# Patient Record
Sex: Male | Born: 1980 | Race: White | Hispanic: No | Marital: Married | Smoking: Never smoker
Health system: Southern US, Community
[De-identification: ages and names within clinical notes are randomized; demographics above are authoritative.]

## PROBLEM LIST (undated history)

## (undated) DIAGNOSIS — Z9989 Dependence on other enabling machines and devices: Principal | ICD-10-CM

## (undated) DIAGNOSIS — E669 Obesity, unspecified: Secondary | ICD-10-CM

## (undated) DIAGNOSIS — M1990 Unspecified osteoarthritis, unspecified site: Secondary | ICD-10-CM

## (undated) DIAGNOSIS — G4733 Obstructive sleep apnea (adult) (pediatric): Secondary | ICD-10-CM

## (undated) DIAGNOSIS — G809 Cerebral palsy, unspecified: Secondary | ICD-10-CM

## (undated) DIAGNOSIS — S069X9A Unspecified intracranial injury with loss of consciousness of unspecified duration, initial encounter: Secondary | ICD-10-CM

## (undated) HISTORY — DX: Obesity, unspecified: E66.9

## (undated) HISTORY — DX: Obstructive sleep apnea (adult) (pediatric): G47.33

## (undated) HISTORY — DX: Cerebral palsy, unspecified: G80.9

## (undated) HISTORY — DX: Dependence on other enabling machines and devices: Z99.89

## (undated) HISTORY — DX: Unspecified intracranial injury with loss of consciousness of unspecified duration, initial encounter: S06.9X9A

---

## 1998-10-17 ENCOUNTER — Encounter: Payer: Self-pay | Admitting: Family Medicine

## 1998-10-17 ENCOUNTER — Ambulatory Visit (HOSPITAL_COMMUNITY): Admission: RE | Admit: 1998-10-17 | Discharge: 1998-10-17 | Payer: Self-pay | Admitting: Family Medicine

## 1998-10-24 ENCOUNTER — Encounter: Payer: Self-pay | Admitting: Family Medicine

## 1998-10-24 ENCOUNTER — Ambulatory Visit (HOSPITAL_COMMUNITY): Admission: RE | Admit: 1998-10-24 | Discharge: 1998-10-24 | Payer: Self-pay | Admitting: Family Medicine

## 2001-12-22 DIAGNOSIS — S069X9A Unspecified intracranial injury with loss of consciousness of unspecified duration, initial encounter: Secondary | ICD-10-CM

## 2001-12-22 DIAGNOSIS — S069XAA Unspecified intracranial injury with loss of consciousness status unknown, initial encounter: Secondary | ICD-10-CM

## 2001-12-22 HISTORY — DX: Unspecified intracranial injury with loss of consciousness of unspecified duration, initial encounter: S06.9X9A

## 2001-12-22 HISTORY — DX: Unspecified intracranial injury with loss of consciousness status unknown, initial encounter: S06.9XAA

## 2002-02-19 HISTORY — PX: OTHER SURGICAL HISTORY: SHX169

## 2002-05-16 ENCOUNTER — Encounter: Admission: RE | Admit: 2002-05-16 | Discharge: 2002-08-14 | Payer: Self-pay | Admitting: *Deleted

## 2002-08-15 ENCOUNTER — Encounter: Admission: RE | Admit: 2002-08-15 | Discharge: 2002-11-13 | Payer: Self-pay | Admitting: *Deleted

## 2002-11-14 ENCOUNTER — Encounter: Admission: RE | Admit: 2002-11-14 | Discharge: 2003-01-31 | Payer: Self-pay | Admitting: *Deleted

## 2003-09-15 ENCOUNTER — Ambulatory Visit (HOSPITAL_COMMUNITY): Admission: RE | Admit: 2003-09-15 | Discharge: 2003-09-15 | Payer: Self-pay | Admitting: Family Medicine

## 2003-09-15 ENCOUNTER — Encounter: Payer: Self-pay | Admitting: Family Medicine

## 2004-01-05 ENCOUNTER — Ambulatory Visit (HOSPITAL_COMMUNITY): Admission: RE | Admit: 2004-01-05 | Discharge: 2004-01-05 | Payer: Self-pay | Admitting: Family Medicine

## 2004-03-14 ENCOUNTER — Ambulatory Visit (HOSPITAL_BASED_OUTPATIENT_CLINIC_OR_DEPARTMENT_OTHER): Admission: RE | Admit: 2004-03-14 | Discharge: 2004-03-14 | Payer: Self-pay | Admitting: Orthopedic Surgery

## 2009-12-22 DIAGNOSIS — G4733 Obstructive sleep apnea (adult) (pediatric): Secondary | ICD-10-CM

## 2009-12-22 HISTORY — DX: Obstructive sleep apnea (adult) (pediatric): G47.33

## 2014-03-22 ENCOUNTER — Ambulatory Visit (INDEPENDENT_AMBULATORY_CARE_PROVIDER_SITE_OTHER): Payer: BC Managed Care – PPO | Admitting: Neurology

## 2014-03-22 ENCOUNTER — Encounter (INDEPENDENT_AMBULATORY_CARE_PROVIDER_SITE_OTHER): Payer: Self-pay

## 2014-03-22 ENCOUNTER — Encounter: Payer: Self-pay | Admitting: Neurology

## 2014-03-22 VITALS — BP 115/74 | HR 72 | Resp 18 | Ht 70.5 in | Wt 297.0 lb

## 2014-03-22 DIAGNOSIS — Z9989 Dependence on other enabling machines and devices: Principal | ICD-10-CM

## 2014-03-22 DIAGNOSIS — G4733 Obstructive sleep apnea (adult) (pediatric): Secondary | ICD-10-CM

## 2014-03-22 HISTORY — DX: Obstructive sleep apnea (adult) (pediatric): G47.33

## 2014-03-22 NOTE — Progress Notes (Signed)
[facility]  Provider:  Melvyn Novas, M D  Referring Provider: Eartha Inch, MD Primary Care Physician:  Eartha Inch, MD  Chief Complaint  Patient presents with  . Follow-up    Room 11  . Sleep Apnea    HPI:  Steve Gutierrez is a 33 y.o. male  Is seen here as a referral/ revisit  from Dr. Cyndia Bent for CPAP compliance.    Steve Gutierrez  is an established patient of our sleep clinic since 2012. He has left  Hemiparesis- he was born with cerebral palsy but this only very mildly impaired him, he suffered a severely disabling head injury and fracture to his arm and shoulder later at age 12 and this has caused him to have significant plegia in the left arm- he is right-hand dominant.   The patient was diagnosed in a split study in 2011 with obstructive sleep apnea.  [name] was the referring physician. On 06-26-2010 the patient had a body mass index of 46.  Endorsed the Epworth sleepiness scale at 12 points, Becks  depression inventory at 8 points.  He had elevated blood pressures prior to the study. His AHI was 66.4 in supine sleep 60.1 and in REM sleep 26.3.  the patient's study was split into a CPAP titration. He did best at 14 cm water with an EPR of 2 cm water .  Today he is here for his yearly revisit. Endorsed  the fatigue score a 23 points, the Epworth score at 9 points. The last time that days of CPAP he was also reviewed. The patient is still using 14 cm water pressure with an EP are currently at 2 cm water. He has moderate air leaks, the residual AHI of only 1.0. He has excellent compliance with an average time and CPAP therapy daily of 8 hours and 17 minutes. His compliance for the last 100 days of 100%.  His sleep time is 9 PM to 5 Am, wakes up spontaneously  but has an alarm back up.he sleeps through the night , he falls asleep promptly. He consumes no caffeine, no nicotine, no ETOH use.    Review of Systems: Out of a complete 14 system review, the  patient complains of only the following symptoms, and all other reviewed systems are negative. Normal Epworth and FSS score.   History   Social History  . Marital Status: Married    Spouse Name: [name]    Number of Children: 0  . Years of Education: Bachelor's   Occupational History  . Not on file.   Social History Main Topics  . Smoking status: Never Smoker   . Smokeless tobacco: Never Used  . Alcohol Use: No  . Drug Use: No  . Sexual Activity: Not on file   Other Topics Concern  . Not on file   Social History Narrative   Patient is married [name]) and lives with his wife and children.   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    Patient is also a Naval architect.   Patient is right-handed.   Patient does not drink any caffeine.   Patient has a Bachelor's degree.             History reviewed. No pertinent family history.  Past Medical History  Diagnosis Date  . 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 Obesity   . OSA (obstructive sleep apnea) 2011    Severe- AHI 66   . OSA on CPAP 03/22/2014    Past Surgical History  Procedure Laterality Date  . Severe fall  02/2002    TBI, COMA    No current outpatient prescriptions on file.   No current facility-administered medications for this visit.    Allergies as of 03/22/2014  . (Not on File)    Vitals: BP 115/74  Pulse 72  Resp 18  Ht 5' 10.5" (1.791 m)  Wt 297 lb (134.718 kg)  BMI 42.00 kg/m2 Last Weight:  Wt Readings from Last 1 Encounters:  03/22/14 297 lb (134.718 kg)   Last Height:   Ht Readings from Last 1 Encounters:  03/22/14 5' 10.5" (1.791 m)    Physical exam:  General: The patient is awake, alert and appears not in acute distress. The patient is well groomed. Head: Normocephalic, atraumatic. Neck is supple. Mallampati 3 uvula is  lower on the right , neck circumference: 19 inches.  Cardiovascular:  Regular rate and rhythm , without  murmurs or carotid bruit, and without distended neck veins. Respiratory: Lungs are clear to auscultation. Skin:  Without evidence of edema, or rash Trunk: BMI remains elevated .  Regular exerciser.  Half- Marathon.   Neurologic exam : The patient is awake and alert, oriented to place and time.  Memory subjective described as intact.  There is a normal attention span & concentration ability. Speech is fluent without dysarthria, dysphonia or aphasia. Mood and affect are appropriate.  Cranial nerves: Pupils are equal and briskly reactive to light. Hearing to finger rub intact. Facial sensation intact to fine touch. Facial motor strength is symmetric and tongue is  midline.  Motor exam:   Left side elevated tone and lower mass .  Sensory: intact ,Coordination:  Only on the right :Rapid alternating movements normal. Finger-to-nose maneuver without evidence of ataxia, dysmetria or tremor.  Gait and station: Patient walks without assistive device.  Assessment:  After physical and neurologic examination, review of laboratory studies, imaging, neurophysiology testing and pre-existing records, assessment is   Obesity with resulting OSA, additional complicating is the uvula asymmetry .  CPAP has let to complete resolution of the AHI.      Plan:  Treatment plan and additional workup : RV in 12 month. No changes in settings.

## 2014-11-07 ENCOUNTER — Telehealth: Payer: Self-pay | Admitting: Neurology

## 2014-11-07 NOTE — Telephone Encounter (Signed)
Left message for patient regarding rescheduling 01/09/15 appointment per Larita Fife leaving, rescheduled to Dr. Oliva Bustard first available on 01/10/15.

## 2014-11-09 ENCOUNTER — Ambulatory Visit (INDEPENDENT_AMBULATORY_CARE_PROVIDER_SITE_OTHER): Payer: BC Managed Care – PPO | Admitting: Sports Medicine

## 2014-11-09 ENCOUNTER — Encounter: Payer: Self-pay | Admitting: Sports Medicine

## 2014-11-09 VITALS — BP 117/78 | HR 80 | Ht 70.0 in | Wt 284.0 lb

## 2014-11-09 DIAGNOSIS — M21372 Foot drop, left foot: Secondary | ICD-10-CM | POA: Insufficient documentation

## 2014-11-09 DIAGNOSIS — G809 Cerebral palsy, unspecified: Secondary | ICD-10-CM | POA: Insufficient documentation

## 2014-11-09 DIAGNOSIS — S069XAA Unspecified intracranial injury with loss of consciousness status unknown, initial encounter: Secondary | ICD-10-CM | POA: Insufficient documentation

## 2014-11-09 DIAGNOSIS — S069X5S Unspecified intracranial injury with loss of consciousness greater than 24 hours with return to pre-existing conscious level, sequela: Secondary | ICD-10-CM

## 2014-11-09 DIAGNOSIS — S069X9A Unspecified intracranial injury with loss of consciousness of unspecified duration, initial encounter: Secondary | ICD-10-CM | POA: Insufficient documentation

## 2014-11-09 NOTE — Progress Notes (Signed)
Steve Gutierrez - 33 y.o. 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 He has been running on a daily basis for the past decade and has completed 3 1/2 marathons this past year in spite of the significant foot drop.  He is interested in potential brace to help his performance and to decrease the amount of toe drag as well as falls that he is sustaining due to catching his toe over obstacles including curbs and roots. He denies any significant pain or muscle spasms. He is not performing any specific therapeutic exercise. He has never tried a brace.Marland Kitchen He is on no chronic medications.  He does have an associated left hand flexor contracture with swan-neck deformities; no brace, TherEx, or intervention tried for this.  ROS:  Per HPI.   HISTORY: Past Medical, Surgical, Social, and Family History Reviewed & Updated per EMR.  Pertinent Historical Findings include: Cerebral palsy from birth with only minimal symptoms to childhood Traumatic brain injury due to repelling accident resulting in significant foot drop and hand weakness. Prior left fifth toe surgery for fracture by Dr. Eulah Pont. History of OSA on CPAP He is a nonsmoker, nondrinker. Works at [employer] as a Psychologist, occupational.   OBJECTIVE:  VS:   HT:5\' 10"  (177.8 cm)   WT:284 lb (128.822 kg)  BMI:40.8          BP:117/78 mmHg  HR:80bpm  TEMP: ( )  RESP:   PHYSICAL EXAM: GENERAL: adult obese Caucasian male. In no discomfort; no respiratory distress   PSYCH: alert and appropriate, good insight  VASCULAR: bilateral DP and PT pulses 2+/4.  No significant edema.  SKIN: Scarring over bilateral anterior knees    NEUROLOGIC EXAM: Gross Deficits: significant left-sided flexor contractures of the wrist and ankle   Speech: fluid with minimal slurring   Gait: see below     Strength: 5 out of 5 strength in right hip flexion and knee extension. Unable to toe off on the left foot at all. 3 out of 5 strength and dorsiflexion and inversion of the ankle. Plantar flexion 5 out of 5   Sensation:  patient reports intact to light touch in all lower extremity dermatomes   Reflexes: left Achilles reflex absent     Walking & Running Gait & Functional Exam:  General:  significant Trendelenburg with left hip shift. Hip flexor dominant. Significant toe drop with poor toe clearance. Significant tibial external rotation on the left.    Strike/foot:  foot slap present with running   Other:  left foot markedly deformed with significant bunion formation and bunionette formation. There is midfoot bossing. There is a well-healed incisional scar over the distal fifth metatarsal. Longitudinal arch is well maintained bilaterally with significant transverse arch breakdown on the left.  Shoes reveal significant wear pattern over the toe of the left foot.    ASSESSMENT: 1. Left foot drop   2. Cerebral palsy   3. Brain injury, with loss of consciousness greater than 24 hours with return to pre-existing conscious level, sequela    Complex gait abnormality related to underlying Cerebral Palsy, complicated by traumatic brain injury. Significant foot drop with Equinus contracture limiting functional gait.    PLAN: See problem based charting & AVS for additional documentation.  Medially wedged toe lift provided  Prescription for dynamic ASO [facility]  Toe Off or equivalent)  Passive ankle dorsi flexion exercises reviewed today  Okay to continue exercise program.  Will likely need new cushioned custom orthotics once brace obtained and will plan to add medially wedged first ray post and likely transverse arch support. > Return in about 8 weeks (around 01/04/2015).

## 2014-12-19 ENCOUNTER — Telehealth: Payer: Self-pay | Admitting: *Deleted

## 2014-12-19 NOTE — Telephone Encounter (Signed)
Spoke to pt when he called back.   Was understandably irritated when asked to change appt again.  He  did not think needed to come in, since insurance requirement.  He was going to call insurance co.  I would let Dr. Vickey Huger know.

## 2014-12-28 ENCOUNTER — Telehealth: Payer: Self-pay | Admitting: *Deleted

## 2014-12-28 NOTE — Telephone Encounter (Signed)
I called and LMVM for pt to return call about appt on 01-10-15 at 1030.  I can keep at 1030, (Dr.Dohmeier may run 15 min later then this).  If this is ok? With him?

## 2014-12-29 NOTE — Telephone Encounter (Signed)
Pt wants to know if he can bring his memory card and have it downloaded.  He will be here for the appointment but doesn't feel he needs a face to face with Dr. Vickey Huger. This is fine with him if it will help her.  Please call back and advise.

## 2014-12-29 NOTE — Telephone Encounter (Signed)
Called pt and relayed that this is good. Will see Dr. Vickey Huger.  Bring card. LMVM for pt.

## 2015-01-09 ENCOUNTER — Ambulatory Visit: Payer: BC Managed Care – PPO | Admitting: Nurse Practitioner

## 2015-01-10 ENCOUNTER — Ambulatory Visit (INDEPENDENT_AMBULATORY_CARE_PROVIDER_SITE_OTHER): Payer: BLUE CROSS/BLUE SHIELD | Admitting: Neurology

## 2015-01-10 ENCOUNTER — Encounter: Payer: Self-pay | Admitting: Neurology

## 2015-01-10 ENCOUNTER — Encounter: Payer: Self-pay | Admitting: Sports Medicine

## 2015-01-10 ENCOUNTER — Ambulatory Visit (INDEPENDENT_AMBULATORY_CARE_PROVIDER_SITE_OTHER): Payer: BLUE CROSS/BLUE SHIELD | Admitting: Sports Medicine

## 2015-01-10 VITALS — BP 128/69 | HR 81 | Resp 22 | Ht 70.25 in | Wt 292.0 lb

## 2015-01-10 VITALS — BP 117/78 | HR 80 | Ht 70.0 in | Wt 283.0 lb

## 2015-01-10 DIAGNOSIS — M21372 Foot drop, left foot: Secondary | ICD-10-CM | POA: Diagnosis not present

## 2015-01-10 DIAGNOSIS — S069X9A Unspecified intracranial injury with loss of consciousness of unspecified duration, initial encounter: Secondary | ICD-10-CM | POA: Insufficient documentation

## 2015-01-10 DIAGNOSIS — G808 Other cerebral palsy: Secondary | ICD-10-CM | POA: Insufficient documentation

## 2015-01-10 DIAGNOSIS — G4733 Obstructive sleep apnea (adult) (pediatric): Secondary | ICD-10-CM

## 2015-01-10 DIAGNOSIS — S069XAA Unspecified intracranial injury with loss of consciousness status unknown, initial encounter: Secondary | ICD-10-CM | POA: Insufficient documentation

## 2015-01-10 DIAGNOSIS — S069X5D Unspecified intracranial injury with loss of consciousness greater than 24 hours with return to pre-existing conscious level, subsequent encounter: Secondary | ICD-10-CM

## 2015-01-10 DIAGNOSIS — Z9989 Dependence on other enabling machines and devices: Principal | ICD-10-CM

## 2015-01-10 NOTE — Progress Notes (Signed)
   Subjective:    Patient ID: Steve Gutierrez, male    DOB: 1981-10-02, 34 y.o.   MRN: [id number]  HPI Steve Gutierrez is a 34 year old male who presents today for 8 week follow up on his L foot drop secondary to CP and a traumatic brain injury. Last visit, he was given an AFO with light weight design and an anterior panel and ankle dorsiflexion exercises. He has been wearing his AFO regularly and notices he has not fallen in several weeks. Before the AFO, he would typically fall every 2 miles. He currently runs 1 - 2 miles everyday and 5- 7 miles on Saturday - all without falling. He has noticed he is a little slower with the AFO, but he is confident he will improve over time. He believes the ankle exercises are helping improve his ankle range of motion.   He is currently attempting to lose weight. He is running frequently as mentioned above and his friend, whom is a Systems analyst, is providing dieting advice. He is trying to decrease his caloric intake everyday and eat more fruit. He is training for his 17th half marathon in the spring.    Review of Systems     Objective:   Physical Exam Well developed. Well nourished. No acute distress.  BP 117/78 mmHg  Pulse 80  Ht 5\' 10"  (1.778 m)  Wt 283 lb (128.368 kg)  BMI 40.61 kg/m2  General: Left sided flexor contractures of the wrist and elbow   Left ankle: No swelling or bruising present. Non-tender to palpation. 5/5 strength in plantar flexion. 3/5 strength in dorsiflexion and inversion. Unable to lift left toe at all.   Left foot: Left foot has bunion formation and transverse arch breakdown. There is a plantar flexion and eversion contracture noted  Gait: Trendelenburg gait with increased left hip Flexion and a full foot drop on left. Significant Tibial external rotation on the left. And pronation and plantar flexion on left foot/ankle.       Assessment & Plan:   Note originally written by [facility]. Note  reviewed/edited by Dr. [facility].

## 2015-01-10 NOTE — Assessment & Plan Note (Addendum)
Left Foot drop secondary to CP and a traumatic brain injury.   Continue with the following:  1) Medially wedged toe lift provided for walking shoes.  2) Dynamic Ankle foot orthosis for running and walking - new design allows functional ankle movement with anterior panel 3) Continue wearing orthotics 4) Advised to continue passive ankle dorsi flexion exercises  Advised to alter diet:  1) Eat 5 servings of fruit daily to help feel satieted and increase weight loss. Talked with patient about the "grapefuit diet" and the importance of fruit intake. Cut down on excess calories and CHOs.  Follow up as needed.

## 2015-01-10 NOTE — Progress Notes (Signed)
[facility]  Provider:  Melvyn Novas, M D  Referring Provider: Eartha Inch, MD Primary Care Physician:  Eartha Inch, MD  Chief Complaint  Patient presents with  . RV cpap    Rm 10, alone    HPI:  Steve Gutierrez is a 34 y.o. male  Is seen here as a referral/ revisit  from Dr. Cyndia Bent for CPAP compliance.    Steve Gutierrez  is an established patient of our sleep clinic since 2012. He has left sided  Hemiparesis- he was born with cerebral palsy , but this only very mildly impaired him, he suffered a severely disabling head injury and fracture to his arm and shoulder later at age 34 and this has caused him to have significant plegia in the left arm- he is right-hand dominant.   The patient was diagnosed in a split study in 2011 with obstructive sleep apnea.  [name] was the referring physician. On 06-26-2010 the patient had a body mass index of 46.  Endorsed the Epworth sleepiness scale at 12 points, Becks  depression inventory at 8 points.  He had elevated blood pressures prior to the study. His AHI was 66.4 in supine sleep 60.1 and in REM sleep 26.3., the patient's study was split into a CPAP titration. He did best at 14 cm water with an EPR of 2 cm water .  2015 , CD - yearly revisit. Endorsed  the fatigue score a 23 points, the Epworth score at 9 points. The last time that days of CPAP he was also reviewed. The patient is still using 14 cm water pressure with an EP are currently at 2 cm water. He has moderate air leaks, the residual AHI of only 1.0. He has excellent compliance with an average time and CPAP therapy daily of 8 hours and 17 minutes. His compliance for the last 100 days of 100%. His sleep time is 9 PM to 5 Am, wakes up spontaneously  but has an alarm back up.he sleeps through the night , he falls asleep promptly. He consumes no caffeine, no nicotine, no ETOH use.   01-10-15, CD yearly RV;  90 days download with 14 cm setting and 3 cm EPR, 8 hours  and 7 minutes, 100% compliance for days and over 4 hours of use. Excellent  AHI of 1.0/hr.  No new problems comorbidities or recent hospitalizations that would influence his CPAP needs his weight has been stable. On his review of systems he endorsed the fatigue score at 37 and the Epworth Sleepiness Scale at 12 points.     Review of Systems: Out of a complete 14 system review, the patient complains of only the following symptoms, and all other reviewed systems are negative. 12 points on Epworth and FSS score of 37, no longer nocturia, no snoring. Sleeps through the night. 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 Marland Kitchen   History   Social History  . Marital Status: Married    Spouse Name: [name]    Number of Children: 0  . Years of Education: Bachelor's   Occupational History  . Not on file.   Social History Main Topics  . Smoking status: Never Smoker   . Smokeless tobacco: Never Used  . Alcohol Use: No  . Drug Use: No  . Sexual Activity: Not on file   Other Topics Concern  . Not on file  Social History Narrative   Patient is married [name]) and lives with his wife and children.   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    Patient is also a Naval architect.   Patient is right-handed.   Patient does not drink any caffeine.   Patient has a Bachelor's degree.             History reviewed. No pertinent family history.  Past Medical History  Diagnosis Date  . 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 Obesity   . OSA (obstructive sleep apnea) 2011    Severe- AHI 66   . OSA on CPAP 03/22/2014    Past Surgical History  Procedure Laterality Date  . Severe fall  02/2002    TBI, COMA    No current outpatient prescriptions on file.   No current  facility-administered medications for this visit.    Allergies as of 01/10/2015  . (No Known Allergies)    Vitals: BP 128/69 mmHg  Pulse 81  Resp 22  Ht 5' 10.25" (1.784 m)  Wt 292 lb (132.45 kg)  BMI 41.62 kg/m2 Last Weight:  Wt Readings from Last 1 Encounters:  01/10/15 292 lb (132.45 kg)   Last Height:   Ht Readings from Last 1 Encounters:  01/10/15 5' 10.25" (1.784 m)    Physical exam:  General: The patient is awake, alert and appears not in acute distress. The patient is well groomed. Head: Normocephalic, atraumatic. Neck is supple. Mallampati 3 uvula is lower on the right , neck circumference: 19 inches.  Cardiovascular:  Regular rate and rhythm , without  murmurs or carotid bruit, and without distended neck veins. Respiratory: Lungs are clear to auscultation. Skin:  Without evidence of edema, or rash Trunk: BMI remains elevated .  Regular exerciser.  Half- Marathon.  Still active, ran 16 thus far.   Neurologic exam : The patient is awake and alert, oriented to place and time.  Memory subjective described as intact.  There is a normal attention span & concentration ability. Speech is fluent without dysarthria, dysphonia or aphasia.  Mood and affect are appropriate.  Cranial nerves: Pupils are equal and briskly reactive to light. Hearing to finger rub intact. Facial sensation intact to fine touch. Facial motor strength is symmetric and tongue is  midline.  Motor exam:   Left side elevated tone and lower mass .  Sensory: intact ,Coordination:  Only on the right :Rapid alternating movements normal. Finger-to-nose maneuver without evidence of ataxia, dysmetria or tremor.  Gait and station: Patient walks without assistive device. Hemiparesis affects mostly right hand and arm.   Assessment:  After physical and neurologic examination, review of laboratory studies, imaging, neurophysiology testing and pre-existing records, assessment is   Obesity with resulting OSA,  additional complicating is the uvula asymmetry, attributed to his CNS hemiparesis.   .  CPAP has let to complete resolution of the AHI.      Plan:  Treatment plan and additional workup :  RV  Wit me or NP in 12 month.  No changes in CPAP settings, currently at 11 cm water .

## 2015-07-16 ENCOUNTER — Ambulatory Visit (INDEPENDENT_AMBULATORY_CARE_PROVIDER_SITE_OTHER): Payer: BLUE CROSS/BLUE SHIELD | Admitting: Sports Medicine

## 2015-07-16 ENCOUNTER — Encounter: Payer: Self-pay | Admitting: Sports Medicine

## 2015-07-16 VITALS — BP 128/71 | Ht 70.0 in | Wt 263.0 lb

## 2015-07-16 DIAGNOSIS — M21372 Foot drop, left foot: Secondary | ICD-10-CM

## 2015-07-16 DIAGNOSIS — G809 Cerebral palsy, unspecified: Secondary | ICD-10-CM

## 2015-07-16 NOTE — Progress Notes (Signed)
Patient ID: Steve Gutierrez, male   DOB: 07-02-1981, 34 y.o.   MRN: [id number]  Patient comes in today for custom orthotics. Please see the office note from Dr. Darrick Penna from January of this year for details regarding history and physical exam findings. In short the patient has a history of cerebral palsy and a left foot drop. Despite this he has been very active and has been able to compete in several half marathons. He has an AFO that he wears in his left shoe. He has had custom orthotics made previously but they are rather rigid and he feels like the rigid orthotics may be contributing to repetitive brace break down.  We were able to construct a new pair of semirigid orthotics for him today. I basically try to replicate what had been done previously and that included adding some rather sizable posting across his first through fourth rays. He did find the orthotic comfortable prior to leaving the office. I would like for the patient to return to the office in 3 weeks to follow-up with Dr. Darrick Penna just to make sure that there is not something else that we need to do with these new orthotics.  Total time spent with the patient was 30 minutes with greater than 50% of the time spent in face-to-face consultation discussing orthotic construction, instruction, and fitting.  Patient was fitted for a : standard, cushioned, semi-rigid orthotic. The orthotic was heated and afterward the patient stood on the orthotic blank positioned on the orthotic stand. The patient was positioned in subtalar neutral position and 10 degrees of ankle dorsiflexion in a weight bearing stance. After completion of molding, a stable base was applied to the orthotic blank. The blank was ground to a stable position for weight bearing. Size: 12 Base: blue EVA Posting: see above Additional orthotic padding: see above

## 2015-08-07 ENCOUNTER — Encounter: Payer: Self-pay | Admitting: Sports Medicine

## 2015-08-07 ENCOUNTER — Ambulatory Visit (INDEPENDENT_AMBULATORY_CARE_PROVIDER_SITE_OTHER): Payer: BLUE CROSS/BLUE SHIELD | Admitting: Sports Medicine

## 2015-08-07 VITALS — BP 127/67

## 2015-08-07 DIAGNOSIS — M21372 Foot drop, left foot: Secondary | ICD-10-CM | POA: Diagnosis not present

## 2015-08-07 NOTE — Progress Notes (Signed)
Patient ID: Steve Gutierrez, male   DOB: [birth year], 34 y.o.   MRN: [id number]   BP 127/67 mmHg  Foot drop on left  We are trying to help him find bracing that will keep him from falling Wearing WalkOn toe brace now and no falls in past month Note he has broken 4 ant panel braces  He runs for exercise but cannot do so without brace  Exam NAD BP 127/67 mmHg  Left arm with flexion contracture of wrist and elbow  Walking without brace he doesn't have dorsiflexion and has complete foot drop  Brace is showing wear pattern along medial foot  Orthotic helps keep him neutral  Adding extra padding to the brace to help prevent breakdown with felt medial post led to improvent in gait with less pronation and he had good control of foot drop Able to run with turn out of left foot but no stumbling

## 2015-08-07 NOTE — Assessment & Plan Note (Signed)
Hopefull we have found the right Brace  I put him in contact with Meliton Rattan to see if he can get into programs workign with foot drop  Recheck pending issues  Cont orthotics

## 2015-08-07 NOTE — Patient Instructions (Signed)
[address] [place] [phone] With Team UP

## 2016-01-07 ENCOUNTER — Encounter: Payer: Self-pay | Admitting: Adult Health

## 2016-01-07 ENCOUNTER — Ambulatory Visit (INDEPENDENT_AMBULATORY_CARE_PROVIDER_SITE_OTHER): Payer: BLUE CROSS/BLUE SHIELD | Admitting: Adult Health

## 2016-01-07 VITALS — BP 122/74 | HR 78 | Resp 20 | Ht 70.0 in | Wt 274.0 lb

## 2016-01-07 DIAGNOSIS — Z9989 Dependence on other enabling machines and devices: Principal | ICD-10-CM

## 2016-01-07 DIAGNOSIS — G4733 Obstructive sleep apnea (adult) (pediatric): Secondary | ICD-10-CM

## 2016-01-07 NOTE — Progress Notes (Addendum)
PATIENT: Steve Gutierrez DOB: Nov 11, 1981  REASON FOR VISIT: follow up- OSA  HISTORY FROM: patient  HISTORY OF PRESENT ILLNESS: Steve Gutierrez is a 35 year old male with a history of obstructive sleep apnea on CPAP. He returns today for a compliance download. His download indicates that he uses machine 30 out of 30 days for compliance of 100%. He uses machine greater than 4 hours each night. His residual AHI is 1.2 on a pressure of 14 cm of water with EPR of 3. The patient does not have a significant leak. Patient states that he goes to bed around 9 PM and arises at 5 AM. He denies having to get up at night to use the restroom. The patient states that he does not go a night without using his CPAP because he sleeps so well with it. The patient's Epworth sleepiness score is 9 and fatigue severity score is 27. He denies any new medical issues. Denies any new neurological symptoms. He returns today for an evaluation.  HISTORY (DOHMEIER): Steve Gutierrez is a 35 y.o. male Is seen here as a referral/ revisit from Dr. Cyndia Bent for CPAP compliance.  Steve Gutierrez is an established patient of our sleep clinic since 2012. 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  The patient was diagnosed in a split study in 2011 with obstructive sleep apnea. [name] was the referring physician. On 06-26-2010 the patient had a body mass index of 46. Endorsed the Epworth sleepiness scale at 12 points, Becks depression inventory at 8 points. He had elevated blood pressures prior to the study. His AHI was 66.4 in supine sleep 60.1 and in REM sleep 26.3., the patient's study was split into a CPAP titration. He did best at 14 cm water with an EPR of 2 cm water .  2015 , CD -  yearly revisit. Endorsed the fatigue score a 23 points, the Epworth score at 9 points. The last time that days of CPAP he was also reviewed. The patient is still using 14 cm water pressure with an EP are currently at 2 cm water. He has moderate air leaks, the residual AHI of only 1.0. He has excellent compliance with an average time and CPAP therapy daily of 8 hours and 17 minutes. His compliance for the last 100 days of 100%. His sleep time is 9 PM to 5 Am, wakes up spontaneously but has an alarm back up.he sleeps through the night , he falls asleep promptly. He consumes no caffeine, no nicotine, no ETOH use.   01-10-15, CD yearly RV;  90 days download with 14 cm setting and 3 cm EPR, 8 hours and 7 minutes, 100% compliance for days and over 4 hours of use. Excellent AHI of 1.0/hr.  No new problems comorbidities or recent hospitalizations that would influence his CPAP needs his weight has been stable. On his review of systems he endorsed the fatigue score at 37 and the Epworth Sleepiness Scale at 12 points  REVIEW OF SYSTEMS: Out of a complete 14 system review of symptoms, the patient complains only of the following symptoms, and all other reviewed systems are negative.  See history of present illness  ALLERGIES: No Known Allergies  HOME MEDICATIONS:  No outpatient prescriptions prior to visit.   No facility-administered medications prior to visit.    PAST MEDICAL HISTORY: Past Medical History  Diagnosis Date  . 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 Obesity   . OSA (obstructive sleep apnea) 2011    Severe- AHI 66   . OSA on CPAP 03/22/2014    PAST SURGICAL HISTORY: Past Surgical History  Procedure Laterality Date  . Severe fall  02/2002    TBI, COMA    FAMILY HISTORY: History reviewed. No pertinent family  history.  SOCIAL HISTORY: Social History   Social History  . Marital Status: Married    Spouse Name: [name]  . Number of Children: 0  . Years of Education: Bachelor's   Occupational History  . Not on file.   Social History Main Topics  . Smoking status: Never Smoker   . Smokeless tobacco: Never Used  . Alcohol Use: No  . Drug Use: No  . Sexual Activity: Not on file   Other Topics Concern  . Not on file   Social History Narrative   Patient is married [name]) and lives with his wife and children.   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    Patient is also a Naval [employer].   Patient is right-handed.   Patient does not drink any caffeine.   Patient has a Bachelor's degree.               PHYSICAL EXAM  Filed Vitals:   01/07/16 0728  BP: 122/74  Pulse: 78  Resp: 20  Height: 5\' 10"  (1.778 m)  Weight: 274 lb (124.286 kg)   Body mass index is 39.32 kg/(m^2).  Generalized: Well developed, in no acute distress  Neck: Circumference 18 inches, Mallampati 3+  Neurological examination  Mentation: Alert oriented to time, place, history taking. Follows all commands speech and language fluent Cranial nerve II-XII: Pupils were equal round reactive to light. Extraocular movements were full, visual field were full on confrontational test. Facial sensation and strength were normal. Uvula tongue midline. Head turning and shoulder shrug  were normal and symmetric. Motor: The motor testing reveals 5 over 5 strength in the right upper and lower extremity. Slightly weaker in the left upper and lower extremity. Right foot drop Good symmetric motor tone is noted throughout.  Sensory: Sensory testing is intact to soft touch on all 4 extremities. No evidence of extinction is noted.  Coordination: Cerebellar testing reveals good finger-nose-finger and heel-to-shin bilaterally.  Gait and station: Patient has a steppage type gait on the right due to foot drop. Tandem gait not  attempted. Reflexes: Deep tendon reflexes are symmetric and normal bilaterally.   DIAGNOSTIC DATA (LABS, IMAGING, TESTING) - I reviewed patient records, labs, notes, testing and imaging myself where available.     ASSESSMENT AND PLAN 35 y.o. year old male  has a past medical history of Cerebral palsy (HCC); Brain injury (HCC) (2003); Obesity; OSA (obstructive sleep apnea) (2011); and OSA on CPAP (03/22/2014). here with:  1. Obstructive sleep apnea on CPAP  Overall the patient is doing well. His compliance download is excellent. He is encouraged to continue using his CPAP nightly. If his symptoms worsen or he develops any new symptoms he should let 05/22/2014 know. He will follow-up in one year with Dr. [place]  Butch Penny, MSN, NP-C 01/07/2016, 7:36 AM [facility] [address], [address] [place], [place] [address] [phone]   I reviewed the above note and documentation by the Nurse Practitioner and agree with the history, physical exam, assessment and plan as outlined above. I was immediately available for face-to-face consultation. Huston Foley, MD, PhD [facility])

## 2016-01-07 NOTE — Patient Instructions (Signed)
Continue using CPAP nightly If your symptoms worsen or you develop new symptoms please let us know.   

## 2016-01-11 ENCOUNTER — Ambulatory Visit: Payer: BLUE CROSS/BLUE SHIELD | Admitting: Adult Health

## 2017-01-04 ENCOUNTER — Encounter: Payer: Self-pay | Admitting: Neurology

## 2017-01-05 ENCOUNTER — Encounter: Payer: Self-pay | Admitting: Neurology

## 2017-01-05 ENCOUNTER — Ambulatory Visit (INDEPENDENT_AMBULATORY_CARE_PROVIDER_SITE_OTHER): Payer: BLUE CROSS/BLUE SHIELD | Admitting: Neurology

## 2017-01-05 VITALS — BP 130/73 | HR 69 | Resp 20 | Ht 70.0 in | Wt 292.0 lb

## 2017-01-05 DIAGNOSIS — Z9989 Dependence on other enabling machines and devices: Secondary | ICD-10-CM

## 2017-01-05 DIAGNOSIS — G4733 Obstructive sleep apnea (adult) (pediatric): Secondary | ICD-10-CM | POA: Diagnosis not present

## 2017-01-05 NOTE — Progress Notes (Signed)
PATIENT: Steve Gutierrez DOB: 01/22/1981  REASON FOR VISIT: follow up- OSA  HISTORY FROM: patient  HISTORY OF PRESENT ILLNESS:  [name] is here for his yearly revisit it has again been 100% compliant patient with an average user time of 7 hours 57 minutes, CPAP is set at 14 cm water with 3 cm EPR and his residual AHI is 0.9. He does have some significant air leaks but they have not reduced the apnea control. He stated he loves his CPAP machine and he feels so much better using it. He also endorses a very low fatigue score at 28 points, and the sleepiness Epworth score at only 5 points. He needs a new interface. He works with [facility], through a friend - now he has to order online or by phone and is frustrated. He didn't know if there is another company out there. I gave him 2 names. [employer] .    MM- January 2017 [name] is a 36 year old male with a history of obstructive sleep apnea on CPAP. He returns today for a compliance download. His download indicates that he uses machine 30 out of 30 days for compliance of 100%. He uses machine greater than 4 hours each night. His residual AHI is 1.2 on a pressure of 14 cm of water with EPR of 3. The patient does not have a significant leak. Patient states that he goes to bed around 9 PM and arises at 5 AM. He denies having to get up at night to use the restroom. The patient states that he does not go a night without using his CPAP because he sleeps so well with it. The patient's Epworth sleepiness score is 9 and fatigue severity score is 27. He denies any new medical issues. Denies any new neurological symptoms. He returns today for an evaluation.  HISTORY (Steve Gutierrez): LOI [name] is a 36 y.o. male Is seen here as a referral/ revisit from Dr. Cyndia Gutierrez for CPAP compliance.  Mr Steve Gutierrez is an established patient of our sleep clinic since 2012. He has left sided Hemiparesis- he was born with cerebral palsy , but this only very mildly impaired him, he suffered  a severely disabling head injury and fracture to his arm and shoulder later at age 67 and this has caused him to have significant plegia in the left arm- he is right-hand dominant.  The patient was diagnosed in a split study in 2011 with obstructive sleep apnea. Dr. Larita Gutierrez was the referring physician. On 06-26-2010 the patient had a body mass index of 46. Endorsed the Epworth sleepiness scale at 12 points, Becks depression inventory at 8 points. He had elevated blood pressures prior to the study. His AHI was 66.4 in supine sleep 60.1 and in REM sleep 26.3., the patient's study was split into a CPAP titration. He did best at 14 cm water with an EPR of 2 cm water .  2015 , CD - yearly revisit. Endorsed the fatigue score a 23 points, the Epworth score at 9 points. The last time that days of CPAP he was also reviewed. The patient is still using 14 cm water pressure with an EP are currently at 2 cm water. He has moderate air leaks, the residual AHI of only 1.0. He has excellent compliance with an average time and CPAP therapy daily of 8 hours and 17 minutes. His compliance for the last 100 days of 100%. His sleep time is 9 PM to 5 Am, wakes up spontaneously but  has an alarm back up.he sleeps through the night , he falls asleep promptly. He consumes no caffeine, no nicotine, no ETOH use.   01-10-15, CD yearly RV; 90 days download with 14 cm setting and 3 cm EPR, 8 hours and 7 minutes, 100% compliance for days and over 4 hours of use. Excellent AHI of 1.0/hr.  No new problems comorbidities or recent hospitalizations that would influence his CPAP needs his weight has been stable. On his review of systems he endorsed the fatigue score at 37 and the Epworth Sleepiness Scale at 12 points  REVIEW OF SYSTEMS: Out of a complete 14 system review of symptoms, the patient complains only of the following symptoms, and all other reviewed systems are negative.  See history of present illness  ALLERGIES: No  Known Allergies  HOME MEDICATIONS: No outpatient prescriptions prior to visit.   No facility-administered medications prior to visit.     PAST MEDICAL HISTORY: Past Medical History:  Diagnosis Date  . 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 Obesity   . OSA (obstructive sleep apnea) 2011   Severe- AHI 66   . OSA on CPAP 03/22/2014    PAST SURGICAL HISTORY: Past Surgical History:  Procedure Laterality Date  . severe fall  02/2002   TBI, COMA    FAMILY HISTORY: No family history on file.  SOCIAL HISTORY: Social History   Social History  . Marital status: Married    Spouse name: Steve Gutierrez  . Number of children: 0  . Years of education: Bachelor's   Occupational History  . Not on file.   Social History Main Topics  . Smoking status: Never Smoker  . Smokeless tobacco: Never Used  . Alcohol use No  . Drug use: No  . Sexual activity: Not on file   Other Topics Concern  . Not on file   Social History Narrative   Patient is married Steve Gutierrez) and lives with his wife and children.   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    Patient is also a Naval architect.   Patient is right-handed.   Patient does not drink any caffeine.   Patient has a Bachelor's degree.               PHYSICAL EXAM  Vitals:   01/05/17 0818  BP: 130/73  Pulse: 69  Resp: 20  Weight: 292 lb (132.5 kg)  Height: 5\' 10"  (1.778 m)   Body mass index is 41.9 kg/m.  Generalized: Well developed, in no acute distress  Neck: Circumference 18 inches, Mallampati 3+  Neurological examination  Mentation: Alert oriented to time, place, history taking. Follows all commands speech and language fluent Cranial nerve II-XII: Pupils were equal round reactive to light. Extraocular movements were full, visual field were full on confrontational  test. Facial sensation and strength were normal. Uvula tongue midline. Head turning and shoulder shrug  were normal and symmetric. Motor: The motor testing reveals 5 over 5 strength in the right upper and lower extremity. Slightly weaker in the left upper and lower extremity. Right foot drop Good symmetric motor tone is noted throughout.  Sensory: Sensory testing is intact to soft touch on all 4 extremities. No evidence of extinction is noted.  Coordination: Cerebellar testing reveals good finger-nose-finger and heel-to-shin bilaterally.  Gait and station: Patient has a steppage  type gait on the right due to foot drop. Tandem gait not attempted. Reflexes: Deep tendon reflexes are symmetric and normal bilaterally.   DIAGNOSTIC DATA (LABS, IMAGING, TESTING) - I reviewed patient records, labs, notes, testing and imaging myself where available.     ASSESSMENT AND PLAN 36 y.o. year old male  has a past medical history of Brain injury (HCC) (2003); Cerebral palsy (HCC); Obesity; OSA (obstructive sleep apnea) (2011); and OSA on CPAP (03/22/2014). here with:  1. Obstructive sleep apnea on CPAP, consuming neither caffeine, tobacco products nor alcohol. Patient also continues to work as a Naval architect.   01/05/2017, 8:40 AM [facility] [address], [address] [place], [place] [address] [phone]

## 2017-01-11 ENCOUNTER — Encounter: Payer: Self-pay | Admitting: Neurology

## 2017-07-01 ENCOUNTER — Encounter: Payer: Self-pay | Admitting: Sports Medicine

## 2017-07-01 ENCOUNTER — Ambulatory Visit (INDEPENDENT_AMBULATORY_CARE_PROVIDER_SITE_OTHER): Payer: BLUE CROSS/BLUE SHIELD | Admitting: Sports Medicine

## 2017-07-01 VITALS — BP 140/80 | Ht 70.0 in | Wt 289.0 lb

## 2017-07-01 DIAGNOSIS — M21372 Foot drop, left foot: Secondary | ICD-10-CM | POA: Diagnosis not present

## 2017-07-01 DIAGNOSIS — G809 Cerebral palsy, unspecified: Secondary | ICD-10-CM | POA: Diagnosis not present

## 2017-07-01 NOTE — Progress Notes (Signed)
Patient ID: Steve Gutierrez, male   DOB: [birth year], 36 y.o.   MRN: [id number]  Patient comes in today for custom orthotics. Patient has a history of cerebral palsy and left foot drop. He recently developed an infection in his left great toe and a podiatrist modified his orthotic as well as his AFO brace. He is unhappy with these modifications. He is an avid runner and he is having pain with running since the changes to his inserts. He would like [place] to construct him a new pair of custom orthotics similar to what he had in the past. Dr. Darrick Penna had also added some blue poron to his AFO brace to help pad it. The podiatrist removed this as well. The patient is also concerned that the podiatrist may have bent his AFO when modifying it.  We were able to construct a new pair of semirigid orthotics for him today. I added a layer of Poron from the first through the fourth rays of the forefoot. We also attached new blue Poron to his AFO. If he finds that his brace is still uncomfortable then we may need to have a new brace constructed for him. He will let us know if we need to make any further changes. Follow-up as needed.  Total time spent with the patient was 30 minutes with greater than 50% of the time spent in face-to-face consultation discussing orthotic construction, instruction, and fitting.  Patient was fitted for a : standard, cushioned, semi-rigid orthotic. The orthotic was heated and afterward the patient stood on the orthotic blank positioned on the orthotic stand. The patient was positioned in subtalar neutral position and 10 degrees of ankle dorsiflexion in a weight bearing stance. After completion of molding, a stable base was applied to the orthotic blank. The blank was ground to a stable position for weight bearing. Size: 13 Base: Blue EVA Posting: See above Additional orthotic padding: see above

## 2018-01-11 ENCOUNTER — Ambulatory Visit: Payer: BLUE CROSS/BLUE SHIELD | Admitting: Neurology

## 2018-01-11 ENCOUNTER — Encounter: Payer: Self-pay | Admitting: Neurology

## 2018-01-11 VITALS — BP 124/84 | HR 95 | Ht 70.0 in | Wt 283.0 lb

## 2018-01-11 DIAGNOSIS — Z9989 Dependence on other enabling machines and devices: Secondary | ICD-10-CM

## 2018-01-11 DIAGNOSIS — Z6835 Body mass index (BMI) 35.0-35.9, adult: Secondary | ICD-10-CM | POA: Diagnosis not present

## 2018-01-11 DIAGNOSIS — E66812 Obesity, class 2: Secondary | ICD-10-CM | POA: Insufficient documentation

## 2018-01-11 DIAGNOSIS — E669 Obesity, unspecified: Secondary | ICD-10-CM | POA: Diagnosis not present

## 2018-01-11 DIAGNOSIS — G4733 Obstructive sleep apnea (adult) (pediatric): Secondary | ICD-10-CM
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
## 2018-05-21 ENCOUNTER — Ambulatory Visit (INDEPENDENT_AMBULATORY_CARE_PROVIDER_SITE_OTHER): Payer: BLUE CROSS/BLUE SHIELD

## 2018-05-21 ENCOUNTER — Ambulatory Visit: Payer: BLUE CROSS/BLUE SHIELD | Admitting: Podiatry

## 2018-05-21 ENCOUNTER — Encounter: Payer: Self-pay | Admitting: Podiatry

## 2018-05-21 DIAGNOSIS — M7751 Other enthesopathy of right foot: Secondary | ICD-10-CM | POA: Diagnosis not present

## 2018-05-21 DIAGNOSIS — M25372 Other instability, left ankle: Secondary | ICD-10-CM | POA: Diagnosis not present

## 2018-05-21 DIAGNOSIS — M7752 Other enthesopathy of left foot: Secondary | ICD-10-CM

## 2018-05-21 DIAGNOSIS — M21372 Foot drop, left foot: Secondary | ICD-10-CM | POA: Diagnosis not present

## 2018-05-21 DIAGNOSIS — M779 Enthesopathy, unspecified: Secondary | ICD-10-CM

## 2018-05-24 ENCOUNTER — Ambulatory Visit: Payer: BLUE CROSS/BLUE SHIELD | Admitting: Orthotics

## 2018-05-24 DIAGNOSIS — M21372 Foot drop, left foot: Secondary | ICD-10-CM

## 2018-05-24 NOTE — Progress Notes (Signed)
Patient being seen for brace to address f/d left; due to stability factors, patient going to be fitted for Ex-Tern brace from [facility].
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
## 2018-06-03 ENCOUNTER — Encounter: Payer: Self-pay | Admitting: Podiatry

## 2018-06-03 ENCOUNTER — Ambulatory Visit: Payer: BLUE CROSS/BLUE SHIELD | Admitting: Podiatry

## 2018-06-03 ENCOUNTER — Ambulatory Visit (INDEPENDENT_AMBULATORY_CARE_PROVIDER_SITE_OTHER): Payer: BLUE CROSS/BLUE SHIELD | Admitting: Orthotics

## 2018-06-03 DIAGNOSIS — M25372 Other instability, left ankle: Secondary | ICD-10-CM | POA: Diagnosis not present

## 2018-06-03 DIAGNOSIS — M21372 Foot drop, left foot: Secondary | ICD-10-CM | POA: Diagnosis not present

## 2018-06-03 NOTE — Progress Notes (Signed)
Patient picked up X-tern brace from [facility].  He really seemed pleased with fit and function.

## 2018-06-04 ENCOUNTER — Encounter: Payer: Self-pay | Admitting: Podiatry

## 2018-06-07 ENCOUNTER — Ambulatory Visit (INDEPENDENT_AMBULATORY_CARE_PROVIDER_SITE_OTHER): Payer: BLUE CROSS/BLUE SHIELD | Admitting: Orthotics

## 2018-06-07 DIAGNOSIS — M25372 Other instability, left ankle: Secondary | ICD-10-CM

## 2018-06-07 DIAGNOSIS — M21372 Foot drop, left foot: Secondary | ICD-10-CM | POA: Diagnosis not present

## 2018-06-07 DIAGNOSIS — M79671 Pain in right foot: Secondary | ICD-10-CM | POA: Diagnosis not present

## 2018-06-07 NOTE — Progress Notes (Signed)
Heated rear support of brace to move strut junction more "laterally" to take pressure off MCJ/navicular  Cast for foot orthotics to address pes planovalgus condition.

## 2018-06-08 NOTE — Progress Notes (Signed)
Subjective: 37 year old male presents the office for follow-up evaluation of left foot drop as well as to pick up a brace directed.  He states that he has been doing well he is been able to run.  He has had no significant pain to the ankle or foot. Denies any systemic complaints such as fevers, chills, nausea, vomiting. No acute changes since last appointment, and no other complaints at this time.   Objective: AAO x3, NAD DP/PT pulses palpable bilaterally, CRT less than 3 seconds Dropfoot present in the left side.  There is no area pinpoint tenderness or pain to vibratory sensation.  There is no edema, erythema, increased warmth.  Varus deformity to the ankle is also present.  HAV present.  There is no pain specifically on the lateral ankle ligaments to the ankle joint itself in the left side and there is no pain fifth metatarsal base talus or other areas. No open lesions or pre-ulcerative lesions.  No pain with calf compression, swelling, warmth, erythema  Assessment: Resolved left ankle pain with dropfoot left side  Plan: -All treatment options discussed with the patient including all alternatives, risks, complications.  -Overall his symptoms are much improved.  He has been running without any issues.  Discussed continuing stretching, rehab exercises to help with ankle sprains and strengthening his ankle.  He was also evaluated today by Raif[facility] Noble and the brace was also dispensed by Raiford Noble. -Patient encouraged to call the office with any questions, concerns, change in symptoms.   Vivi Barrack DPM

## 2018-06-10 ENCOUNTER — Ambulatory Visit: Payer: BLUE CROSS/BLUE SHIELD | Admitting: Orthotics

## 2018-06-10 DIAGNOSIS — M25372 Other instability, left ankle: Secondary | ICD-10-CM

## 2018-06-10 NOTE — Progress Notes (Signed)
Added wedge medial to left shoe orthotic as well as making adjustments to brace.

## 2018-06-11 ENCOUNTER — Encounter: Payer: Self-pay | Admitting: Podiatry

## 2018-06-23 ENCOUNTER — Encounter: Payer: Self-pay | Admitting: Podiatry

## 2018-07-01 ENCOUNTER — Ambulatory Visit: Payer: BLUE CROSS/BLUE SHIELD | Admitting: Orthotics

## 2018-07-01 ENCOUNTER — Ambulatory Visit: Payer: BLUE CROSS/BLUE SHIELD | Admitting: Podiatry

## 2018-07-01 DIAGNOSIS — M21372 Foot drop, left foot: Secondary | ICD-10-CM

## 2018-07-01 NOTE — Progress Notes (Signed)
Patient came in today to pick up custom made foot orthotics.  The goals were accomplished and the patient reported no dissatisfaction with said orthotics.  Patient was advised of breakin period and how to report any issues. 

## 2018-07-12 ENCOUNTER — Ambulatory Visit: Payer: BLUE CROSS/BLUE SHIELD | Admitting: Orthotics

## 2018-07-12 DIAGNOSIS — M21372 Foot drop, left foot: Secondary | ICD-10-CM

## 2018-07-12 DIAGNOSIS — M25372 Other instability, left ankle: Secondary | ICD-10-CM

## 2018-07-16 NOTE — Progress Notes (Signed)
Picked up modified brace; will wear this weekend.

## 2018-07-20 ENCOUNTER — Ambulatory Visit (INDEPENDENT_AMBULATORY_CARE_PROVIDER_SITE_OTHER): Payer: BLUE CROSS/BLUE SHIELD | Admitting: Orthotics

## 2018-07-20 ENCOUNTER — Encounter: Payer: Self-pay | Admitting: Podiatry

## 2018-07-20 DIAGNOSIS — M25372 Other instability, left ankle: Secondary | ICD-10-CM

## 2018-07-20 DIAGNOSIS — M21372 Foot drop, left foot: Secondary | ICD-10-CM

## 2018-07-20 NOTE — Progress Notes (Signed)
XS-tern brace just not working for patient; ordered thuesne brace flex spry to try.

## 2018-07-22 ENCOUNTER — Other Ambulatory Visit: Payer: BLUE CROSS/BLUE SHIELD | Admitting: Orthotics

## 2018-07-26 ENCOUNTER — Ambulatory Visit: Payer: BLUE CROSS/BLUE SHIELD | Admitting: Podiatry

## 2018-08-09 ENCOUNTER — Encounter: Payer: Self-pay | Admitting: Podiatry

## 2018-08-09 ENCOUNTER — Ambulatory Visit: Payer: BLUE CROSS/BLUE SHIELD | Admitting: Orthotics

## 2018-08-09 ENCOUNTER — Ambulatory Visit: Payer: BLUE CROSS/BLUE SHIELD | Admitting: Podiatry

## 2018-08-09 DIAGNOSIS — M21372 Foot drop, left foot: Secondary | ICD-10-CM

## 2018-08-09 DIAGNOSIS — M25372 Other instability, left ankle: Secondary | ICD-10-CM | POA: Diagnosis not present

## 2018-08-09 NOTE — Progress Notes (Signed)
Patient pleased with brace and f/o.Marland Kitchen

## 2018-08-10 NOTE — Progress Notes (Signed)
Subjective: 37 year old male presents the office today for follow-up evaluation of the ankle pain.  Since getting a new brace he states he is doing much better and the brace is fitting well not causing any issues he is back to running without any problems.  He gets some very minimal occasional discomfort to the ankle joint but overall is doing much better.  He has not had any swelling or any issues. Denies any systemic complaints such as fevers, chills, nausea, vomiting. No acute changes since last appointment, and no other complaints at this time.   Objective: AAO x3, NAD DP/PT pulses palpable bilaterally, CRT less than 3 seconds Foot drop is Present on the left side.  At this time there is no area pinpoint tenderness or pain to vibratory sensation.  There is restriction of motion of the ankle joint.  There is no edema, erythema, increase in warmth.  Overall he is doing much better. No open lesions or pre-ulcerative lesions.  No pain with calf compression, swelling, warmth, erythema  Assessment: Foot drop left side with resolved ankle pain  Plan: -All treatment options discussed with the patient including all alternatives, risks, complications.  -At this point his symptoms are much improved.  Continue the brace for now.  Also reevaluated him.  He said no new issues.  We will see him back in 1 year for follow-up but I encouraged him to call any questions or concerns any changes in symptoms. -Patient encouraged to call the office with any questions, concerns, change in symptoms.   Vivi Barrack DPM

## 2018-09-02 DIAGNOSIS — IMO0001 Reserved for inherently not codable concepts without codable children: Secondary | ICD-10-CM | POA: Insufficient documentation

## 2018-09-02 DIAGNOSIS — H918X1 Other specified hearing loss, right ear: Secondary | ICD-10-CM | POA: Insufficient documentation

## 2019-01-09 ENCOUNTER — Encounter: Payer: Self-pay | Admitting: Neurology

## 2019-01-10 ENCOUNTER — Encounter: Payer: Self-pay | Admitting: Neurology

## 2019-01-10 ENCOUNTER — Ambulatory Visit: Payer: BLUE CROSS/BLUE SHIELD | Admitting: Neurology

## 2019-01-10 ENCOUNTER — Encounter

## 2019-01-10 VITALS — BP 126/76 | HR 88 | Ht 70.0 in | Wt 267.0 lb

## 2019-01-10 DIAGNOSIS — Z9989 Dependence on other enabling machines and devices: Secondary | ICD-10-CM

## 2019-01-10 DIAGNOSIS — G4733 Obstructive sleep apnea (adult) (pediatric): Secondary | ICD-10-CM

## 2019-01-10 DIAGNOSIS — G808 Other cerebral palsy: Secondary | ICD-10-CM | POA: Diagnosis not present

## 2019-01-10 DIAGNOSIS — S069X5S Unspecified intracranial injury with loss of consciousness greater than 24 hours with return to pre-existing conscious level, sequela: Secondary | ICD-10-CM

## 2019-01-10 NOTE — Progress Notes (Signed)
PATIENT: Steve Gutierrez DOB: 05-29-1981  REASON FOR VISIT: follow up- OSA  HISTORY FROM: patient  HISTORY OF PRESENT ILLNESS:  Interval history for Mr. Steve RicherRussell G. Hoyt, a 38 year old Caucasian male patient with Left hemiplegia.  [name] had no new medical developments over the last 12 months, no hospitalizations.  He has stayed a very highly compliant CPAP user with an average user time of 7 hours 43 minutes, 100% compliance by days and time with a CPAP that is set at 14 cm water pressure with 3 cm EPR.  He does have moderate air leaks his residual AHI is 0.6/h, no central apneas are emerging and his air leak speak occasionally on weekends but the average  Work- and week-day air leak is under 15 L.  100% compliance.   Interval history from 11 January 2018, [name] is seen here today for his yearly revisit on CPAP compliance.  He continues to be 100% compliant patient with 7 hours 43 minutes average daily use, CPAP is still set to 14 cmH2O with 3 cm EPR and his residual AHI 0.7 apneas per hour no central apneas emerging, he had does have high air leaks but they seem not to influence the apnea count. He keeps tightening his mask- and the trend is going down ( looking at the graph). He uses a FFM Airfit F 20 in large size.      [name] is here for his yearly revisit it has again been 100% compliant patient with an average user time of 7 hours 57 minutes, CPAP is set at 14 cm water with 3 cm EPR and his residual AHI is 0.9. He does have some significant air leaks but they have not reduced the apnea control. He stated he loves his CPAP machine and he feels so much better using it. He also endorses a very low fatigue score at 28 points, and the sleepiness Epworth score at only 5 points. He needs a new interface. [facility], through a friend - now he has to order online or by phone and is frustrated. He didn't know if there is another company out there. I gave him 2 names. [employer] and  [employer].    MM- January 2017 [name] is a 38 year old male with a history of obstructive sleep apnea on CPAP. He returns today for a compliance download. His download indicates that he uses machine 30 out of 30 days for compliance of 100%. He uses machine greater than 4 hours each night. His residual AHI is 1.2 on a pressure of 14 cm of water with EPR of 3. The patient does not have a significant leak. Patient states that he goes to bed around 9 PM and arises at 5 AM. He denies having to get up at night to use the restroom. The patient states that he does not go a night without using his CPAP because he sleeps so well with it. The patient's Epworth sleepiness score is 9 and fatigue severity score is 27. He denies any new medical issues. Denies any new neurological symptoms. He returns today for an evaluation.  HISTORY (Steve Gutierrez): Steve Gutierrez is a 38 y.o. male, seen here as a revisit from [name] for CPAP compliance.  Mr.  Steve Gutierrez is an established patient of our sleep clinic since 2012. He has left sided Hemiparesis- he was born with cerebral palsy , but this only very mildly impaired him, he suffered a severely disabling head injury  and fracture to his arm and shoulder later at age 79 and this has caused him to have significant plegia in the left arm- he is right-hand dominant.  The patient was diagnosed in a split study in 2011 with obstructive sleep apnea. [name] was the referring physician.  On 06-26-2010 the patient had a body mass index of 46. Endorsed the Epworth sleepiness scale at 12 points, Becks depression inventory at 8 points. He had elevated blood pressures prior to the study. His AHI was 66.4 in supine sleep 60.1 and in REM sleep 26.3., the patient's study was split into a CPAP titration. He did best at 14 cm water with an EPR of 2 cm water .  2015 , CD - yearly revisit. Endorsed the fatigue score a 23 points, the Epworth score at 9 points. The last time that days of  CPAP he was also reviewed. The patient is still using 14 cm water pressure with an EP are currently at 2 cm water. He has moderate air leaks, the residual AHI of only 1.0. He has excellent compliance with an average time and CPAP therapy daily of 8 hours and 17 minutes. His compliance for the last 100 days of 100%. His sleep time is 9 PM to 5 Am, wakes up spontaneously but has an alarm back up.he sleeps through the night , he falls asleep promptly. He consumes no caffeine, no nicotine, no ETOH use.   01-10-15, CD yearly RV; 90 days download with 14 cm setting and 3 cm EPR, 8 hours and 7 minutes, 100% compliance for days and over 4 hours of use. Excellent AHI of 1.0/hr.  No new problems comorbidities or recent hospitalizations that would influence his CPAP needs his weight has been stable. On his review of systems he endorsed the fatigue score at 37 and the Epworth Sleepiness Scale at 12 points  REVIEW OF SYSTEMS: Out of a complete 14 system review of symptoms, the patient complains only of the following symptoms, and all other reviewed systems are negative.  He is not snoring through the CPAP- has airl-eaks only on weekends but is unsure why, FFM user, no nocturia. Average sleep time over 7 hours. 100% compliance of user time and 30/30  Days.  No naps in daytime.  No weight gain, he is losing a bit.  Allergic rhinitis - congested , takes mucinex.  See history of present illness  ALLERGIES: No Known Allergies  HOME MEDICATIONS: Outpatient Medications Prior to Visit  Medication Sig Dispense Refill  . phentermine 30 MG capsule Take by mouth.     No facility-administered medications prior to visit.     PAST MEDICAL HISTORY: Past Medical History:  Diagnosis Date  . 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 Obesity   . OSA  (obstructive sleep apnea) 2011   Severe- AHI 66   . OSA on CPAP 03/22/2014    PAST SURGICAL HISTORY: Past Surgical History:  Procedure Laterality Date  . severe fall  02/2002   TBI, COMA    FAMILY HISTORY: No sleep apnea history. Both parents living and healthy, siblings - 2 brothers, healthy.   SOCIAL HISTORY: Social History   Socioeconomic History  . Marital status: Married    Spouse name: [name]  . Number of children: 0  . Years of education: Bachelor's  .  Highest education level: Not on file  Occupational History  . Not on file  Social Needs  . Financial resource strain: Not on file  . Food insecurity:    Worry: Not on file    Inability: Not on file  . Transportation needs:    Medical: Not on file    Non-medical: Not on file  Tobacco Use  . Smoking status: Never Smoker  . Smokeless tobacco: Never Used  Substance and Sexual Activity  . Alcohol use: No  . Drug use: No  . Sexual activity: Not on file  Lifestyle  . Physical activity:    Days per week: Not on file    Minutes per session: Not on file  . Stress: Not on file  Relationships  . Social connections:    Talks on phone: Not on file    Gets together: Not on file    Attends religious service: Not on file    Active member of club or organization: Not on file    Attends meetings of clubs or organizations: Not on file    Relationship status: Not on file  . Intimate partner violence:    Fear of current or ex partner: Not on file    Emotionally abused: Not on file    Physically abused: Not on file    Forced sexual activity: Not on file  Other Topics Concern  . Not on file  Social History Narrative   Patient is married [name]) and lives with his wife and children.   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    Patient is also a Naval architect.   Patient is right-handed.   Patient does not drink any caffeine.   Patient has a Bachelor's degree.            PHYSICAL EXAM  Vitals:   01/10/19 0931  BP:  126/76  Pulse: 88  Weight: 267 lb (121.1 kg)  Height: 5\' 10"  (1.778 m)   Body mass index is 38.31 kg/m.  Generalized: Well developed, in no acute distress  Neck: Circumference 18 inches, Mallampati 3+  Neurological examination  Mentation: Alert oriented to time, place, history taking. Follows all commands speech and language fluent Cranial nerve: no impairment of smell or taste.  Pupils were equal round reactive to light. Extraocular movements were full, visual field were full on confrontational test. Facial sensation and strength were normal. Uvula and  tongue midline.  Head turning to the right is weaker and leftshoulder shrug is weak- unable to lift the left shoulder.  Motor: the patient has spasticity and contractures in left hand and wrist. He is right hand dominant.   Left hand interdigital muscle atrophy.  Slightly weaker in the left upper and lower extremity. Right foot drop remains evident . Sensory: Sensory testing is intact to soft touch on all 4 extremities. The paralyzed side has still the same feeling.  No evidence of extinction is noted.  Coordination:  Only right good finger-nose-finger is intact  , and only right heel-to-shin- he can not perform this maneuver on the left  Gait and station: Patient has a steppage type gait on the right due to foot drop. Tandem gait not attempted. Reflexes: Upper deep tendon reflexes are slightly more brisk on the left - spasticity, , the lower DTR are not elicited.   DIAGNOSTIC DATA (LABS, IMAGING, TESTING) - I reviewed patient records, labs, notes, testing and imaging myself where available.  Epworth 8/24 , FSS 36/ 63 points. Unchanged since using CPAP  since 2014.  Marland Kitchen.   CPAP download until 01-10-2019  ASSESSMENT AND PLAN 38 y.o. year old male here with:  1. Obstructive sleep apnea on CPAP, consuming neither caffeine, tobacco products, nor alcohol.  2. The patient has a brain injury related left hemiparesis with contractures in hand  at wrist, he does not have cerebral palsy.  3. Obesity- has been stable. Dr Cyndia BentBadger as send him to [facility] bariat[facility] clinic and he has seen a nutritionist. He kept a food diary. He is losing weight and working out.  He is not longer interested in a medical weight management program with Dr. Quillian Quincearen Beasley, MD.   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  He is due for a new machine - I will be ordering an autotitration capable machine  With the same settings as currently used.  FFM is preferred by him.   Melvyn Novasarmen Rathana Viveros, MD    01/10/2019, 9:46 AM [facility] [address], [address] [place], [place] [address] [phone]

## 2019-07-05 ENCOUNTER — Ambulatory Visit: Payer: BC Managed Care – PPO | Admitting: Podiatry

## 2019-07-05 ENCOUNTER — Ambulatory Visit (INDEPENDENT_AMBULATORY_CARE_PROVIDER_SITE_OTHER): Payer: BC Managed Care – PPO

## 2019-07-05 ENCOUNTER — Encounter: Payer: Self-pay | Admitting: Podiatry

## 2019-07-05 ENCOUNTER — Other Ambulatory Visit: Payer: Self-pay

## 2019-07-05 VITALS — Temp 97.2°F

## 2019-07-05 DIAGNOSIS — M19072 Primary osteoarthritis, left ankle and foot: Secondary | ICD-10-CM

## 2019-07-05 DIAGNOSIS — M069 Rheumatoid arthritis, unspecified: Secondary | ICD-10-CM | POA: Diagnosis not present

## 2019-07-05 DIAGNOSIS — M21372 Foot drop, left foot: Secondary | ICD-10-CM

## 2019-07-05 DIAGNOSIS — M84364S Stress fracture, left fibula, sequela: Secondary | ICD-10-CM | POA: Diagnosis not present

## 2019-07-05 DIAGNOSIS — H9041 Sensorineural hearing loss, unilateral, right ear, with unrestricted hearing on the contralateral side: Secondary | ICD-10-CM | POA: Insufficient documentation

## 2019-07-08 ENCOUNTER — Telehealth: Payer: Self-pay | Admitting: *Deleted

## 2019-07-08 DIAGNOSIS — M84364S Stress fracture, left fibula, sequela: Secondary | ICD-10-CM

## 2019-07-08 NOTE — Telephone Encounter (Signed)
-----   Message from Trula Slade, DPM sent at 07/08/2019  7:32 AM EDT ----- Can you please order a CT scan of the left lower extremity? I want it to be from below the knee to the toes if they can.

## 2019-07-08 NOTE — Progress Notes (Signed)
Subjective: 38 year old male presents the office today for concerns of left ankle pain.  He still wearing the brace.  Some irritation of the skin at the proximal aspect.  He states that some days he has no pain with ankle feels great.  Uppercase states that he has pain.  He actually states that when he makes it feel better.  He denies any recent injury or trauma.  He has noticed some swelling. Denies any systemic complaints such as fevers, chills, nausea, vomiting. No acute changes since last appointment, and no other complaints at this time.   Objective: AAO x3, NAD DP/PT pulses palpable bilaterally, CRT less than 3 seconds Dropfoot is present on the left side.  There is tenderness mostly along the dorsal medial aspect of foot on the talonavicular joint on the medial aspect ankle.  Valgus deformity is present.  There is mild edema to the area.  There is erythema but he states this is from a sunburn.  There is mild discomfort of the area today.  There is no pain to the ankle.  No pain with ankle joint range of motion.  No skin breakdown identified today. No open lesions or pre-ulcerative lesions.  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  -X-rays obtained and reviewed.  Likely old stress fractures of the fibula present with significant bone callus formation.  Arthritic changes present to the talonavicular joint and mildly to the ankle joint.  No definitive evidence of acute fracture.   -Given the likely stress fractures of the fibula as well as the deformity of the foot I will get a CT scan of the left lower extremity to evaluate deformity in the joints.  Also this is to rule out further stress fracture.  For now continue in the brace.  Padding on the proximal aspect was present  causing skin irritation. -Patient encouraged to call the office with any questions, concerns, change in symptoms.   Trula Slade DPM

## 2019-07-08 NOTE — Telephone Encounter (Signed)
Orders to Gretta Arab, RN for pre-cert and fax to [facility].

## 2019-07-11 ENCOUNTER — Ambulatory Visit: Payer: BLUE CROSS/BLUE SHIELD | Admitting: Podiatry

## 2019-07-25 ENCOUNTER — Telehealth: Payer: Self-pay | Admitting: *Deleted

## 2019-07-25 DIAGNOSIS — S92902A Unspecified fracture of left foot, initial encounter for closed fracture: Secondary | ICD-10-CM

## 2019-07-25 DIAGNOSIS — M19079 Primary osteoarthritis, unspecified ankle and foot: Secondary | ICD-10-CM

## 2019-07-25 NOTE — Telephone Encounter (Signed)
Pt states he received a call from [facility] stating his CT would have to be scheduled at 2 separate appts one for the leg and one for the foot. Pt request an explanation.

## 2019-07-25 NOTE — Telephone Encounter (Signed)
[facility] states CT for below the knee to the toes requires CT of left foot ordered and PA for appt on 07/28/2019.

## 2019-07-25 NOTE — Telephone Encounter (Signed)
Left message informing pt we were working to get the CT of the foot pre-certed so we could have it performed at the same time as the leg.

## 2019-07-26 NOTE — Telephone Encounter (Signed)
Pt states his ankle is where he has his pain and he doesn't know why it was not scheduled 1st and why he has one appt for the CT in the morning on Thursday and the other in the evening. I told pt I did not know the answer to either but once the ankle was pre-cert the appts could hopefully be scheduled together.

## 2019-07-26 NOTE — Telephone Encounter (Signed)
When I did the original order I wanted to get the fibula ankle and foot. I thought that the order for left lower extremity would cover this. I want to make sure that we get the ankle . If the tib-fib covers the ankle that would be fine. If I need to do another peer to peer let me know. Otherwise we can do the foot and ankle.

## 2019-07-27 ENCOUNTER — Other Ambulatory Visit: Payer: Self-pay

## 2019-07-27 DIAGNOSIS — S82899A Other fracture of unspecified lower leg, initial encounter for closed fracture: Secondary | ICD-10-CM

## 2019-07-28 ENCOUNTER — Ambulatory Visit
Admission: RE | Admit: 2019-07-28 | Discharge: 2019-07-28 | Disposition: A | Discharge status: Home / Self Care | Payer: BC Managed Care – PPO | Source: Ambulatory Visit | Attending: Podiatry | Admitting: Podiatry

## 2019-07-28 ENCOUNTER — Other Ambulatory Visit: Payer: Self-pay

## 2019-07-28 ENCOUNTER — Inpatient Hospital Stay: Admission: RE | Admit: 2019-07-28 | Payer: Self-pay | Source: Ambulatory Visit

## 2019-07-28 DIAGNOSIS — S82899A Other fracture of unspecified lower leg, initial encounter for closed fracture: Secondary | ICD-10-CM

## 2019-07-29 ENCOUNTER — Encounter: Payer: Self-pay | Admitting: Podiatry

## 2019-08-02 ENCOUNTER — Encounter: Payer: Self-pay | Admitting: Podiatry

## 2019-08-02 ENCOUNTER — Ambulatory Visit: Payer: BC Managed Care – PPO | Admitting: Podiatry

## 2019-08-02 ENCOUNTER — Other Ambulatory Visit: Payer: Self-pay

## 2019-08-02 VITALS — Temp 97.6°F

## 2019-08-02 DIAGNOSIS — M779 Enthesopathy, unspecified: Secondary | ICD-10-CM | POA: Diagnosis not present

## 2019-08-02 DIAGNOSIS — M19079 Primary osteoarthritis, unspecified ankle and foot: Secondary | ICD-10-CM

## 2019-08-02 NOTE — Progress Notes (Signed)
Subjective: 38 year old male presents the office to discuss CT scan results and for possible steroid injection.  He states that he has pain intermittently.  Some days he will have discomfort and sometimes not.  He states it feels better when he runs.  No recent injuries. Denies any systemic complaints such as fevers, chills, nausea, vomiting. No acute changes since last appointment, and no other complaints at this time.   Objective: AAO x3, NAD DP/PT pulses palpable bilaterally, CRT less than 3 seconds The majority tenderness is on the medial aspect foot on the toe navicular joint also he does get discomfort of the sinus tarsi.  Decreased range of motion.  Flatfoot deformities present.  No open lesions or pre-ulcerative lesions.  No pain with calf compression, swelling, warmth, erythema  CT scan 07/28/2019: Findings most consistent with lateral hindfoot impingement.  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  Flattening of the head of the talus may be due to remote injury. Lucency subjacent to the subchondral bone plate of the head of the talus is worrisome for fragment instability. Synovitis about the talonavicular joint is noted.  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  Osteochondral lesion medial talar dome.    Assessment: Capsulitis, osteoarthritis  Plan: -All treatment options discussed with the patient including all alternatives, risks, complications.  -They can look at the CT scan and x-rays with him. -Steroid injection performed.  See procedure note below. -Continue with brace.  He did not have it today.  Bring to the next appointment for possible modifications. -Discussed ultimately surgical intervention if needed.  He does not want to consider this at this time. -Patient encouraged to call the office with any questions, concerns, change in symptoms.    Procedure: Injection intermediate  joint Discussed alternatives, risks, complications and verbal consent was obtained.  Location: Left sinus tarsi Skin Prep: Betadine Injectate: 0.5cc 0.5% marcaine plain, 0.5 cc 2% lidocaine plain and, 1 cc kenalog 10. Disposition: Patient tolerated procedure well. Injection site dressed with a band-aid.  Post-injection care was discussed and return precautions discussed.   Procedure: Injection intermediate joint Discussed alternatives, risks, complications and verbal consent was obtained.  Location: Left TNJ Skin Prep: Betadine Injectate: 0.5cc 0.5% marcaine plain, 0.5 cc 2% lidocaine plain and, 1 cc kenalog 10. Disposition: Patient tolerated procedure well. Injection site dressed with a band-aid.  Post-injection care was discussed and return precautions discussed.   Trula Slade DPM

## 2019-08-04 ENCOUNTER — Ambulatory Visit: Payer: BC Managed Care – PPO | Admitting: Podiatry

## 2019-08-23 ENCOUNTER — Ambulatory Visit: Payer: BC Managed Care – PPO | Admitting: Orthotics

## 2019-08-23 ENCOUNTER — Other Ambulatory Visit: Payer: Self-pay

## 2019-08-23 ENCOUNTER — Ambulatory Visit: Payer: BC Managed Care – PPO | Admitting: Podiatry

## 2019-08-23 DIAGNOSIS — M19079 Primary osteoarthritis, unspecified ankle and foot: Secondary | ICD-10-CM

## 2019-08-23 DIAGNOSIS — M779 Enthesopathy, unspecified: Secondary | ICD-10-CM | POA: Diagnosis not present

## 2019-08-23 DIAGNOSIS — M21372 Foot drop, left foot: Secondary | ICD-10-CM

## 2019-08-23 NOTE — Progress Notes (Signed)
Subjective: 38 year old male presents the office today for follow-up evaluation of left chronic ankle/foot pain. He states that the injection on the medial aspect has done well. He gets pian more to the outside of the ankle at this time. No recent injury or fall.  Denies any systemic complaints such as fevers, chills, nausea, vomiting. No acute changes since last appointment, and no other complaints at this time.   Objective: AAO x3, NAD DP/PT pulses palpable bilaterally, CRT less than 3 seconds There is no tenderness on the medial aspect foot on the toe navicular joint or along the sinus tarsi.  The majority of tenderness is along the peroneal tendon just posterior to the lateral malleolus. Tendon appears intact. Decreased range of motion.  Flatfoot deformities present.  No open lesions or pre-ulcerative lesions.  No pain with calf compression, swelling, warmth, erythema  CT scan 07/28/2019: Findings most consistent with lateral hindfoot impingement.  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  Flattening of the head of the talus may be due to remote injury. Lucency subjacent to the subchondral bone plate of the head of the talus is worrisome for fragment instability. Synovitis about the talonavicular joint is noted.  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  Osteochondral lesion medial talar dome.  Assessment: Capsulitis, osteoarthritis; tendonitis   Plan: -All treatment options discussed with the patient including all alternatives, risks, complications.  -We are going to hold off on another injection today. Liliane Channel is going to add a small lateral post to see if this will help take pressure off of the plantar aspect of the foot.  If this causes medial foot pain to remove it.  Continue ice as well as Voltaren gel.  Return in about 6 weeks (around 10/04/2019).  Trula Slade DPM

## 2019-08-23 NOTE — Progress Notes (Signed)
Made following adjustments: F/O added lateral wedge. Brace:  Added soft interface to superior edge of brace calf support; glued velcro "spots" back onto calf support.

## 2019-09-05 ENCOUNTER — Encounter: Payer: Self-pay | Admitting: Podiatry

## 2019-09-05 NOTE — Telephone Encounter (Signed)
Dr. Jacqualyn Posey states MyChart would not allow him to respond to pt, but he stated, "it wouldn't let me respond directly to him but if it starts to hurt more on the inside (medial) aspect of the foot then I would remove the extra padding". I informed pt of Dr. Leigh Aurora statement and pt states the medial arch area is burning and he will remove the pad once he gets home.

## 2019-09-13 ENCOUNTER — Encounter: Payer: Self-pay | Admitting: Podiatry

## 2019-09-15 ENCOUNTER — Other Ambulatory Visit: Payer: Self-pay

## 2019-09-15 ENCOUNTER — Ambulatory Visit (INDEPENDENT_AMBULATORY_CARE_PROVIDER_SITE_OTHER): Payer: BC Managed Care – PPO | Admitting: Podiatry

## 2019-09-15 DIAGNOSIS — M722 Plantar fascial fibromatosis: Secondary | ICD-10-CM

## 2019-09-15 NOTE — Progress Notes (Signed)
Subjective: 38 year old male presents the office today requesting steroid injection to the left heel for plantar fasciitis.  He states that the pain is intermittent.  No recent injury or trauma.  Ankles been doing well.  He states that some days he feels that he can run some days not able to.  Denies any systemic complaints such as fevers, chills, nausea, vomiting. No acute changes since last appointment, and no other complaints at this time.   Objective: AAO x3, NAD DP/PT pulses palpable bilaterally, CRT less than 3 seconds Chronic arthritic changes present of the ankle no significant discomfort.  Today the majority of tenderness is along the medial band plantar fashion the arch of the foot as well as on the insertion of the calcaneus.  Appears to be intact.  Achilles tendon intact.  Negative no sign.  No pain with calf compression, swelling, warmth, erythema  Assessment: Left foot plantar fasciitis  Plan: -All treatment options discussed with the patient including all alternatives, risks, complications.  -Steroid injection performed today.  See procedure note below.  Work saw him to modify his insert. -Patient encouraged to call the office with any questions, concerns, change in symptoms.   Procedure: Injection Tendon/Ligament Discussed alternatives, risks, complications and verbal consent was obtained.  Location: Left plantar fascia at the glabrous junction; medial approach. Skin Prep: Alcohol Injectate: 0.5cc 0.5% marcaine plain, 0.5 cc 2% lidocaine plain and, 1 cc kenalog 10. Disposition: Patient tolerated procedure well. Injection site dressed with a band-aid.  Post-injection care was discussed and return precautions discussed.   Trula Slade DPM

## 2019-09-26 ENCOUNTER — Ambulatory Visit: Payer: BC Managed Care – PPO | Admitting: Podiatry

## 2019-11-01 ENCOUNTER — Encounter: Payer: Self-pay | Admitting: Neurology

## 2019-11-02 ENCOUNTER — Ambulatory Visit: Payer: BLUE CROSS/BLUE SHIELD | Admitting: Neurology

## 2019-11-02 ENCOUNTER — Other Ambulatory Visit: Payer: Self-pay

## 2019-11-02 ENCOUNTER — Encounter: Payer: Self-pay | Admitting: Neurology

## 2019-11-02 VITALS — BP 122/81 | HR 75 | Temp 97.7°F | Ht 70.0 in | Wt 279.0 lb

## 2019-11-02 DIAGNOSIS — S069X9S Unspecified intracranial injury with loss of consciousness of unspecified duration, sequela: Secondary | ICD-10-CM

## 2019-11-02 DIAGNOSIS — Z9989 Dependence on other enabling machines and devices: Secondary | ICD-10-CM | POA: Diagnosis not present

## 2019-11-02 DIAGNOSIS — G4733 Obstructive sleep apnea (adult) (pediatric): Secondary | ICD-10-CM | POA: Diagnosis not present

## 2019-11-02 DIAGNOSIS — S069X5S Unspecified intracranial injury with loss of consciousness greater than 24 hours with return to pre-existing conscious level, sequela: Secondary | ICD-10-CM

## 2019-11-02 NOTE — Progress Notes (Signed)
PATIENT: Steve Gutierrez DOB: [birth year]  REASON FOR VISIT: follow up- OSA  HISTORY FROM: patient  HISTORY OF PRESENT ILLNESS:      Interval history for [name], a meanwhile 38 year old patient on CPAP. Steve Gutierrez has been a highly compliant CPAP user again, his average user time is about 8 hours nightly he is using CPAP S9 AutoSet which is over 40 years old male, set at 14 cm pressure with 3 cm EPR and his residual AHI is 1.2 which speaks for an excellent resolution.  There have been high air leaks but they have not let to increased AHI is. Its time for a new machine.    Last visit:  year 38 year old Caucasian male patient with Left hemiplegia.  Steve Gutierrez  had no new medical developments over the last 12 months, no hospitalizations.  He has stayed a very highly compliant CPAP user with an average user time of 7 hours 43 minutes, 100% compliance by days and time with a CPAP that is set at 14 cm water pressure with 3 cm EPR.  He does have moderate air leaks his residual AHI is 0.6/h, no central apneas are emerging and his air leak speak occasionally on weekends but the average  Work- and week-day air leak is under 15 L.  100% compliance.   Interval history from 11 January 2018, Steve Gutierrez is seen here today for his yearly revisit on CPAP compliance.  He continues to be 100% compliant patient with 7 hours 43 minutes average daily use, CPAP is still set to 14 cmH2O with 3 cm EPR and his residual AHI 0.7 apneas per hour no central apneas emerging, he had does have high air leaks but they seem not to influence the apnea count. He keeps tightening his mask- and the trend is going down ( looking at the graph). He uses a FFM Airfit F 20 in large size.      Steve Gutierrez is here for his yearly revisit it has again been 100% compliant patient with an average user time of 7 hours 57 minutes, CPAP is set at 14 cm water with 3 cm EPR and his residual AHI is 0.9. He does have some significant air  leaks but they have not reduced the apnea control. He stated he loves his CPAP machine and he feels so much better using it. He also endorses a very low fatigue score at 28 points, and the sleepiness Epworth score at only 5 points. He needs a new interface. He works with [facility], through a friend - now he has to order online or by phone and is frustrated. He didn't know if there is another company out there. I gave him 2 names. [employer] and [employer].    Steve Gutierrez is a 37 year old male with a history of obstructive sleep apnea on CPAP. He returns today for a compliance download. His download indicates that he uses machine 30 out of 30 days for compliance of 100%. He uses machine greater than 4 hours each night. His residual AHI is 1.2 on a pressure of 14 cm of water with EPR of 3. The patient does not have a significant leak. Patient states that he goes to bed around 9 PM and arises at 5 AM. He denies having to get up at night to use the restroom. The patient states that he does not go a night without using his CPAP because he sleeps so well  with it. The patient's Epworth sleepiness score is 9 and fatigue severity score is 27. He denies any new medical issues. Denies any new neurological symptoms. He returns today for an evaluation.  HISTORY (Steve Gutierrez): Steve Gutierrez is a 38 y.o. male, seen here as a revisit from Dr. Melford Aase for CPAP compliance.  [name] is an established patient of our sleep clinic since 2012. 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  The patient was diagnosed in a split study in 2011 with obstructive sleep apnea. Dr. Veronia Beets was the referring physician.  On 06-26-2010 the patient had a body mass index of 46.  Endorsed the Epworth sleepiness scale at 12 points, Becks depression inventory at 8 points. He had elevated blood pressures prior to the study. His AHI was 66.4 in supine sleep 60.1 and in REM sleep 26.3., the patient's study was split into a CPAP titration. He did best at 14 cm water with an EPR of 2 cm water .  2015 , CD - yearly revisit. Endorsed the fatigue score a 23 points, the Epworth score at 9 points. The last time that days of CPAP he was also reviewed. The patient is still using 14 cm water pressure with an EP are currently at 2 cm water. He has moderate air leaks, the residual AHI of only 1.0. He has excellent compliance with an average time and CPAP therapy daily of 8 hours and 17 minutes. His compliance for the last 100 days of 100%. His sleep time is 9 PM to 5 Am, wakes up spontaneously but has an alarm back up.he sleeps through the night , he falls asleep promptly. He consumes no caffeine, no nicotine, no ETOH use.   01-10-15, CD yearly RV; 90 days download with 14 cm setting and 3 cm EPR, 8 hours and 7 minutes, 100% compliance for days and over 4 hours of use. Excellent AHI of 1.0/hr.  No new problems comorbidities or recent hospitalizations that would influence his CPAP needs his weight has been stable. On his review of systems he endorsed the fatigue score at 37 and the Epworth Sleepiness Scale at 12 points  REVIEW OF SYSTEMS: Out of a complete 14 system review of symptoms, the patient complains only of the following symptoms, and all other reviewed systems are negative.  He is not snoring through the CPAP- has airl-eaks only on weekends but is unsure why, FFM user, no nocturia. Average sleep time over 7 hours. 100% compliance of user time and 30/30  Days.  No naps in daytime.  No weight gain, he is losing a bit.  Allergic rhinitis - congested , takes mucinex.  See history of present illness  ALLERGIES: No Known Allergies  HOME MEDICATIONS: Outpatient Medications Prior  to Visit  Medication Sig Dispense Refill  . phentermine 30 MG capsule Take by mouth.     No facility-administered medications prior to visit.     PAST MEDICAL HISTORY: Past Medical History:  Diagnosis Date  . 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 Obesity   .  OSA (obstructive sleep apnea) 2011   Severe- AHI 66   . OSA on CPAP 03/22/2014    PAST SURGICAL HISTORY: Past Surgical History:  Procedure Laterality Date  . severe fall  02/2002   TBI, COMA    FAMILY HISTORY: No sleep apnea history. Both parents living and healthy, siblings - 2 brothers, healthy.   SOCIAL HISTORY: Social History   Socioeconomic History  . Marital status: Married    Spouse name: Sheria LangCameron  . Number of children: 0  . Years of education: Bachelor's  . Highest education level: Not on file  Occupational History  . Not on file  Social Needs  . Financial resource strain: Not on file  . Food insecurity    Worry: Not on file    Inability: Not on file  . Transportation needs    Medical: Not on file    Non-medical: Not on file  Tobacco Use  . Smoking status: Never Smoker  . Smokeless tobacco: Never Used  Substance and Sexual Activity  . Alcohol use: No  . Drug use: No  . Sexual activity: Not on file  Lifestyle  . Physical activity    Days per week: Not on file    Minutes per session: Not on file  . Stress: Not on file  Relationships  . Social Musicianconnections    Talks on phone: Not on file    Gets together: Not on file    Attends religious service: Not on file    Active member of club or organization: Not on file    Attends meetings of clubs or organizations: Not on file    Relationship status: Not on file  . Intimate partner violence    Fear of current or ex partner: Not on file    Emotionally abused: Not on file    Physically  abused: Not on file    Forced sexual activity: Not on file  Other Topics Concern  . Not on file  Social History Narrative   Patient is married [name](Cameron) and lives with his wife and children.   Patient works at a [employer].    Patient is also a Naval architectvolunteer fireman.   Patient is right-handed.   Patient does not drink any caffeine.   Patient has a Bachelor's degree.            PHYSICAL EXAM  Vitals:   11/02/19 0937  BP: 122/81  Pulse: 75  Temp: 97.7 F (36.5 C)  Weight: 279 lb (126.6 kg)  Height: 5\' 10"  (1.778 m)   Body mass index is 40.03 kg/m.  Generalized: Well developed, in no acute distress  Neck: Circumference 18 inches, Mallampati 3+  Neurological examination  Mentation: Alert oriented to time, place, history taking. Follows all commands speech and language fluent Cranial nerve: no impairment of smell or taste.  Pupils were equal round reactive to light. Extraocular movements were full, visual field were full on confrontational test. Facial sensation and strength were normal. Uvula and  tongue midline.  Head turning to the right is weaker and leftshoulder shrug is weak- unable to lift the left shoulder.  Motor: the patient has spasticity and contractures in left hand and wrist. He is right hand dominant.   Left hand interdigital muscle atrophy.  Slightly weaker in the left upper and lower extremity. Right foot drop remains evident . Sensory: Sensory testing is intact to soft touch on all 4 extremities. The paralyzed side has still the same feeling. No evidence of extinction is  noted.  Coordination:  Only right good finger-nose-finger is intact  , and only right heel-to-shin- he can not perform this maneuver on the left  Gait and station: Patient has a steppage type gait on the right due to foot drop. Tandem gait not attempted. Reflexes: Upper deep tendon reflexes are slightly more brisk on the left - spasticity, , the lower DTR are not elicited.   DIAGNOSTIC DATA  (LABS, IMAGING, TESTING) - I reviewed patient records, labs, notes, testing and imaging myself where available.  Epworth 9/24 , FSS 43/ 63 points. Unchanged since using CPAP since 2014.  Marland Kitchen   CPAP download until [phone]  ASSESSMENT AND PLAN 38 y.o. year old male here with:  1. Obstructive sleep apnea on CPAP, consuming neither caffeine, tobacco products, nor alcohol.  2. The patient has a brain injury related left hemiparesis with contractures in hand at wrist, he does not have cerebral palsy.  3. Obesity- has been stable. Dr Cyndia Bent as send him to [facility] bariatric clinic and he has seen a nutritionist. He kept a food diary. He is losing weight and working out.    Patient also continues to work as a Naval architect. He is due for a new machine - I will be ordering an autotitration capable machine  With the same settings as currently used.  FFM is preferred by him.   Melvyn Novas, MD    11/02/2019, 9:47 AM [facility] [address], [address] [place], [place] [address] [phone]

## 2019-12-26 ENCOUNTER — Other Ambulatory Visit: Payer: Self-pay | Admitting: Orthopaedic Surgery

## 2020-01-05 ENCOUNTER — Encounter (HOSPITAL_COMMUNITY): Payer: Self-pay

## 2020-01-05 NOTE — Pre-Procedure Instructions (Signed)
Your procedure is scheduled on Tuesday, January 19th, from 09:00 AM to 1:10 PM.  Report to [facility] Main Entrance "A" at 07:00 A.M., and check in at the Admitting office.  Call this number if you have problems the morning of surgery:  [phone]  Call [phone] if you have any questions prior to your surgery date Monday-Friday 8am-4pm    Remember:  Do not eat after midnight the night before your surgery.  You may drink clear liquids until 06:00 AM the morning of your surgery.   Clear liquids allowed are: Water, Non-Citrus Juices (without pulp), Carbonated Beverages, Clear Tea, Black Coffee Only, and Gatorade.  Enhanced Recovery after Surgery is a protocol used to improve the stress on your body and your recovery after surgery.  .  . The day of surgery:  o Drink ONE (1) Pre-Surgery Clear Ensure by 06:00 AM the morning of surgery.   o This drink was given to you during your hospital  pre-op appointment visit. o Nothing else to drink after completing the  Pre-Surgery Clear Ensure.     DO NOT TAKE ANY MEDICATIONS THE MORNING OF SURGERY.   Stop Phentermine 2 weeks prior to surgery.  As of today, STOP taking any Aspirin (unless otherwise instructed by your surgeon), Aleve, Naproxen, Ibuprofen, Motrin, Advil, Goody's, BC's, all herbal medications, fish oil, and all vitamins.    The Morning of Surgery  Do not wear jewelry.  Do not wear lotions, powders, colognes, or deodorant  Men may shave face and neck.  Do not bring valuables to the hospital.  [facility] is not responsible for any belongings or valuables.  If you are a smoker, DO NOT Smoke 24 hours prior to surgery  If you wear a CPAP at night please bring your mask, tubing, and machine the morning of surgery   Remember that you must have someone to transport you home after your surgery, and remain with you for 24 hours if you are discharged the same day.   Please bring cases for contacts, glasses, hearing aids,  dentures or bridgework because it cannot be worn into surgery.    Leave your suitcase in the car.  After surgery it may be brought to your room.  For patients admitted to the hospital, discharge time will be determined by your treatment team.  Patients discharged the day of surgery will not be allowed to drive home.    Special instructions:   [facility]- Preparing For Surgery  Before surgery, you can play an important role. Because skin is not sterile, your skin needs to be as free of germs as possible. You can reduce the number of germs on your skin by washing with CHG (chlorahexidine gluconate) Soap before surgery.  CHG is an antiseptic cleaner which kills germs and bonds with the skin to continue killing germs even after washing.    Oral Hygiene is also important to reduce your risk of infection.  Remember - BRUSH YOUR TEETH THE MORNING OF SURGERY WITH YOUR REGULAR TOOTHPASTE  Please do not use if you have an allergy to CHG or antibacterial soaps. If your skin becomes reddened/irritated stop using the CHG.  Do not shave (including legs and underarms) for at least 48 hours prior to first CHG shower. It is OK to shave your face.  Please follow these instructions carefully.   1. Shower the NIGHT BEFORE SURGERY and the MORNING OF SURGERY with CHG Soap.   2. If you chose to wash your hair, wash  your hair first as usual with your normal shampoo.  3. After you shampoo, rinse your hair and body thoroughly to remove the shampoo.  4. Use CHG as you would any other liquid soap. You can apply CHG directly to the skin and wash gently with a scrungie or a clean washcloth.   5. Apply the CHG Soap to your body ONLY FROM THE NECK DOWN.  Do not use on open wounds or open sores. Avoid contact with your eyes, ears, mouth and genitals (private parts). Wash Face and genitals (private parts)  with your normal soap.   6. Wash thoroughly, paying special attention to the area where your surgery will be  performed.  7. Thoroughly rinse your body with warm water from the neck down.  8. DO NOT shower/wash with your normal soap after using and rinsing off the CHG Soap.  9. Pat yourself dry with a CLEAN TOWEL.  10. Wear CLEAN PAJAMAS to bed the night before surgery, wear comfortable clothes the morning of surgery  11. Place CLEAN SHEETS on your bed the night of your first shower and DO NOT SLEEP WITH PETS.    Day of Surgery:   Remember to brush your teeth WITH YOUR REGULAR TOOTHPASTE. Please shower the morning of surgery with the CHG soap Do not apply any deodorants/lotions. Please wear clean clothes to the hospital/surgery center.      Please read over the following fact sheets that you were given.

## 2020-01-06 ENCOUNTER — Encounter (HOSPITAL_COMMUNITY)
Admission: RE | Admit: 2020-01-06 | Discharge: 2020-01-06 | Disposition: A | Discharge status: Home / Self Care | Payer: BC Managed Care – PPO | Source: Ambulatory Visit | Attending: Orthopaedic Surgery | Admitting: Orthopaedic Surgery

## 2020-01-06 ENCOUNTER — Other Ambulatory Visit: Payer: Self-pay

## 2020-01-06 ENCOUNTER — Encounter (HOSPITAL_COMMUNITY): Payer: Self-pay

## 2020-01-06 ENCOUNTER — Other Ambulatory Visit (HOSPITAL_COMMUNITY)
Admission: RE | Admit: 2020-01-06 | Discharge: 2020-01-06 | Disposition: A | Discharge status: Home / Self Care | Payer: BC Managed Care – PPO | Source: Ambulatory Visit | Attending: Orthopaedic Surgery | Admitting: Orthopaedic Surgery

## 2020-01-06 DIAGNOSIS — Z01812 Encounter for preprocedural laboratory examination: Secondary | ICD-10-CM | POA: Diagnosis present

## 2020-01-06 HISTORY — DX: Unspecified osteoarthritis, unspecified site: M19.90

## 2020-01-06 LAB — BASIC METABOLIC PANEL
Anion gap: 10 (ref 5–15)
BUN: 19 mg/dL (ref 6–20)
CO2: 26 mmol/L (ref 22–32)
Calcium: 9.4 mg/dL (ref 8.9–10.3)
Chloride: 103 mmol/L (ref 98–111)
Creatinine, Ser: 0.8 mg/dL (ref 0.61–1.24)
GFR calc Af Amer: >60 mL/min (ref >60)
GFR calc non Af Amer: >60 mL/min (ref >60)
Glucose, Bld: 93 mg/dL (ref 70–99)
Potassium: 4.2 mmol/L (ref 3.5–5.1)
Sodium: 139 mmol/L (ref 135–145)

## 2020-01-06 LAB — CBC
HCT: 47 % (ref 39.0–52.0)
Hemoglobin: 15.2 g/dL (ref 13.0–17.0)
MCH: 29.6 pg (ref 26.0–34.0)
MCHC: 32.3 g/dL (ref 30.0–36.0)
MCV: 91.4 fL (ref 80.0–100.0)
Platelets: 295 10*3/uL (ref 150–400)
RBC: 5.14 MIL/uL (ref 4.22–5.81)
RDW: 12.8 % (ref 11.5–15.5)
WBC: 8.7 10*3/uL (ref 4.0–10.5)
nRBC: 0 % (ref 0.0–0.2)

## 2020-01-06 LAB — SURGICAL PCR SCREEN
MRSA, PCR: NEGATIVE
Staphylococcus aureus: NEGATIVE

## 2020-01-06 LAB — SARS CORONAVIRUS 2 (TAT 6-24 HRS): SARS Coronavirus 2: NEGATIVE

## 2020-01-06 NOTE — Progress Notes (Addendum)
PCP - Glory Rosebush MD Cardiologist - n/a   CPAP - yes  ERAS Protcol - yes  PRE-SURGERY Ensure or G2- ensure given  COVID TEST- 01/06/20   Anesthesia review: Spoke with Fayrene Fearing about patients Phentermine and it is ok. LD 01/05/19  Patient denies shortness of breath, fever, cough and chest pain at PAT appointment   All instructions explained to the patient, with a verbal understanding of the material. Patient agrees to go over the instructions while at home for a better understanding. Patient also instructed to self quarantine after being tested for COVID-19. The opportunity to ask questions was provided.

## 2020-01-06 NOTE — Progress Notes (Signed)
[facility] [place], [place] - [address] OF [address] [place] [address] Phone: [phone] Fax: [phone]      Your procedure is scheduled on January 19  Report to [facility] Main Entrance "A" at 0700 A.M., and check in at the Admitting office.  Call this number if you have problems the morning of surgery:  [phone]  Call [phone] if you have any questions prior to your surgery date Monday-Friday 8am-4pm    Remember:  Do not eat after midnight the night before your surgery  You may drink clear liquids until 0600 am the morning of your surgery.   Clear liquids allowed are: Water, Non-Citrus Juices (without pulp), Carbonated Beverages, Clear Tea, Black Coffee Only, and Gatorade    Enhanced Recovery after Surgery for Orthopedics Enhanced Recovery after Surgery is a protocol used to improve the stress on your body and your recovery after surgery.  Patient Instructions  . The night before surgery:  o No food after midnight. ONLY clear liquids after midnight  .  Marland Kitchen The day of surgery (if you do NOT have diabetes):  o Drink ONE (1) Pre-Surgery Clear Ensure by 0600 am the morning of your surgery o This drink was given to you during your hospital  pre-op appointment visit. o The pre-op nurse will instruct you on the time to drink the  Pre-Surgery Ensure depending on your surgery time. o Finish the drink at the designated time by the pre-op nurse.  o Nothing else to drink after completing the  Pre-Surgery Clear Ensure.          If you have questions, please contact your surgeon's office.   There are NO medications that you need to take the morning of your surgery  Stop taking Phentermine if you have not already done so.  As of today,  STOP taking any Aspirin (unless otherwise instructed by your surgeon), Aleve, Naproxen, Ibuprofen, Motrin, Advil, Goody's, BC's, all herbal medications, fish  oil, and all vitamins.    The Morning of Surgery  Do not wear jewelry.  Do not wear lotions, powders, or colognes, or deodorant  Men may shave face and neck.  Do not bring valuables to the hospital.  [facility] is not responsible for any belongings or valuables.  If you are a smoker, DO NOT Smoke 24 hours prior to surgery  If you wear a CPAP at night please bring your mask, tubing, and machine the morning of surgery   Remember that you must have someone to transport you home after your surgery, and remain with you for 24 hours if you are discharged the same day.   Please bring cases for contacts, glasses, hearing aids, dentures or bridgework because it cannot be worn into surgery.    Leave your suitcase in the car.  After surgery it may be brought to your room.  For patients admitted to the hospital, discharge time will be determined by your treatment team.  Patients discharged the day of surgery will not be allowed to drive home.    Special instructions:   [facility]- Preparing For Surgery  Before surgery, you can play an important role. Because skin is not sterile, your skin needs to be as free of germs as possible. You can reduce the number of germs on your skin by washing with CHG (chlorahexidine gluconate) Soap before surgery.  CHG is an antiseptic cleaner which kills  germs and bonds with the skin to continue killing germs even after washing.    Oral Hygiene is also important to reduce your risk of infection.  Remember - BRUSH YOUR TEETH THE MORNING OF SURGERY WITH YOUR REGULAR TOOTHPASTE  Please do not use if you have an allergy to CHG or antibacterial soaps. If your skin becomes reddened/irritated stop using the CHG.  Do not shave (including legs and underarms) for at least 48 hours prior to first CHG shower. It is OK to shave your face.  Please follow these instructions carefully.   1. Shower the NIGHT BEFORE SURGERY and the MORNING OF SURGERY with CHG Soap.   2. If  you chose to wash your hair, wash your hair first as usual with your normal shampoo.  3. After you shampoo, rinse your hair and body thoroughly to remove the shampoo.  4. Use CHG as you would any other liquid soap. You can apply CHG directly to the skin and wash gently with a scrungie or a clean washcloth.   5. Apply the CHG Soap to your body ONLY FROM THE NECK DOWN.  Do not use on open wounds or open sores. Avoid contact with your eyes, ears, mouth and genitals (private parts). Wash Face and genitals (private parts)  with your normal soap.   6. Wash thoroughly, paying special attention to the area where your surgery will be performed.  7. Thoroughly rinse your body with warm water from the neck down.  8. DO NOT shower/wash with your normal soap after using and rinsing off the CHG Soap.  9. Pat yourself dry with a CLEAN TOWEL.  10. Wear CLEAN PAJAMAS to bed the night before surgery, wear comfortable clothes the morning of surgery  11. Place CLEAN SHEETS on your bed the night of your first shower and DO NOT SLEEP WITH PETS.    Day of Surgery:  Please shower the morning of surgery with the CHG soap Do not apply any deodorants/lotions. Please wear clean clothes to the hospital/surgery center.   Remember to brush your teeth WITH YOUR REGULAR TOOTHPASTE.   Please read over the following fact sheets that you were given.

## 2020-01-06 NOTE — Progress Notes (Signed)
Patients wife tested positive on 12/30 patient had a negative COVID test on 1/7 (care everywhere), patient to be retested today

## 2020-01-08 NOTE — Patient Instructions (Addendum)
Continue CPAP nightly and greater than 4 hours each night   Follow up in 12 months with Dr Dohmeier  Sleep Apnea Sleep apnea affects breathing during sleep. It causes breathing to stop for a short time or to become shallow. It can also increase the risk of:  Heart attack.  Stroke.  Being very overweight (obese).  Diabetes.  Heart failure.  Irregular heartbeat. The goal of treatment is to help you breathe normally again. What are the causes? There are three kinds of sleep apnea:  Obstructive sleep apnea. This is caused by a blocked or collapsed airway.  Central sleep apnea. This happens when the brain does not send the right signals to the muscles that control breathing.  Mixed sleep apnea. This is a combination of obstructive and central sleep apnea. The most common cause of this condition is a collapsed or blocked airway. This can happen if:  Your throat muscles are too relaxed.  Your tongue and tonsils are too large.  You are overweight.  Your airway is too small. What increases the risk?  Being overweight.  Smoking.  Having a small airway.  Being older.  Being male.  Drinking alcohol.  Taking medicines to calm yourself (sedatives or tranquilizers).  Having family members with the condition. What are the signs or symptoms?  Trouble staying asleep.  Being sleepy or tired during the day.  Getting angry a lot.  Loud snoring.  Headaches in the morning.  Not being able to focus your mind (concentrate).  Forgetting things.  Less interest in sex.  Mood swings.  Personality changes.  Feelings of sadness (depression).  Waking up a lot during the night to pee (urinate).  Dry mouth.  Sore throat. How is this diagnosed?  Your medical history.  A physical exam.  A test that is done when you are sleeping (sleep study). The test is most often done in a sleep lab but may also be done at home. How is this treated?   Sleeping on your  side.  Using a medicine to get rid of mucus in your nose (decongestant).  Avoiding the use of alcohol, medicines to help you relax, or certain pain medicines (narcotics).  Losing weight, if needed.  Changing your diet.  Not smoking.  Using a machine to open your airway while you sleep, such as: ? An oral appliance. This is a mouthpiece that shifts your lower jaw forward. ? A CPAP device. This device blows air through a mask when you breathe out (exhale). ? An EPAP device. This has valves that you put in each nostril. ? A BPAP device. This device blows air through a mask when you breathe in (inhale) and breathe out.  Having surgery if other treatments do not work. It is important to get treatment for sleep apnea. Without treatment, it can lead to:  High blood pressure.  Coronary artery disease.  In men, not being able to have an erection (impotence).  Reduced thinking ability. Follow these instructions at home: Lifestyle  Make changes that your doctor recommends.  Eat a healthy diet.  Lose weight if needed.  Avoid alcohol, medicines to help you relax, and some pain medicines.  Do not use any products that contain nicotine or tobacco, such as cigarettes, e-cigarettes, and chewing tobacco. If you need help quitting, ask your doctor. General instructions  Take over-the-counter and prescription medicines only as told by your doctor.  If you were given a machine to use while you sleep, use it only as  told by your doctor.  If you are having surgery, make sure to tell your doctor you have sleep apnea. You may need to bring your device with you.  Keep all follow-up visits as told by your doctor. This is important. Contact a doctor if:  The machine that you were given to use during sleep bothers you or does not seem to be working.  You do not get better.  You get worse. Get help right away if:  Your chest hurts.  You have trouble breathing in enough air.  You have  an uncomfortable feeling in your back, arms, or stomach.  You have trouble talking.  One side of your body feels weak.  A part of your face is hanging down. These symptoms may be an emergency. Do not wait to see if the symptoms will go away. Get medical help right away. Call your local emergency services (911 in the [place] Do not drive yourself to the hospital. Summary  This condition affects breathing during sleep.  The most common cause is a collapsed or blocked airway.  The goal of treatment is to help you breathe normally while you sleep. This information is not intended to replace advice given to you by your health care provider. Make sure you discuss any questions you have with your health care provider. Document Revised: 09/24/2018 Document Reviewed: 08/03/2018 Elsevier Patient Education  [place].

## 2020-01-08 NOTE — Progress Notes (Signed)
PATIENT: Steve Gutierrez DOB: [birth year]  REASON FOR VISIT: follow up HISTORY FROM: patient  Chief Complaint  Patient presents with  . Follow-up    3 mon f/u. Alone. Rm 2. No new concerns at this time.      HISTORY OF PRESENT ILLNESS: Today 01/09/20 Steve Gutierrez Kidney is a 39 y.o. male here today for follow up for OSA. He has recently received his new CPAP machine and returns today for his initial compliance review.  He is doing very well with CPAP therapy.  He has adjusted well to his new machine.  He has no difficulty no concerns today.  He is scheduled for left ankle triple arthrodesis with Achilles lengthening tomorrow at [facility].  He is anticipating discharge following surgical procedure if all goes well.  Compliance report dated 12/06/2019 through 01/04/2020 reveals that he has used CPAP 30 out of the last 30 days for compliance of 100%.  He used CPAP greater than 4 hours all 30 days.  Average usage was 8 hours and 21 minutes.  Residual AHI was 1.2 on 8 to 16 cm of water and EPR of 3.  There was no significant leak noted.  HISTORY: (copied from Dr Dohmeier's note on 11/02/2019)  Interval history for Steve Gutierrez, a meanwhile 39 year old patient on CPAP. [name] has been a highly compliant CPAP user again, his average user time is about 8 hours nightly he is using CPAP S9 AutoSet which is over 41 years old male, set at 14 cm pressure with 3 cm EPR and his residual AHI is 1.2 which speaks for an excellent resolution.  There have been high air leaks but they have not let to increased AHI is. Its time for a new machine.   Last visit:  year 39 year old Caucasian male patient with Left hemiplegia.  [name]  had no new medical developments over the last 12 months, no hospitalizations.  He has stayed a very highly compliant CPAP user with an average user time of 7 hours 43 minutes, 100% compliance by days and time with a CPAP that is set at 14 cm water pressure with 3 cm EPR.  He  does have moderate air leaks his residual AHI is 0.6/h, no central apneas are emerging and his air leak speak occasionally on weekends but the average  Work- and week-day air leak is under 15 L.  100% compliance.   Interval history from 11 January 2018, [name] is seen here today for his yearly revisit on CPAP compliance.  He continues to be 100% compliant patient with 7 hours 43 minutes average daily use, CPAP is still set to 14 cmH2O with 3 cm EPR and his residual AHI 0.7 apneas per hour no central apneas emerging, he had does have high air leaks but they seem not to influence the apnea count. He keeps tightening his mask- and the trend is going down ( looking at the graph). He uses a FFM Airfit F 20 in large size.    [name] is here for his yearly revisit it has again been 100% compliant patient with an average user time of 7 hours 57 minutes, CPAP is set at 14 cm water with 3 cm EPR and his residual AHI is 0.9. He does have some significant air leaks but they have not reduced the apnea control. He stated he loves his CPAP machine and he feels so much better using it. He also endorses a  very low fatigue score at 28 points, and the sleepiness Epworth score at only 5 points. He needs a new interface. He works with [facility], through a friend - now he has to order online or by phone and is frustrated. He didn't know if there is another company out there. I gave him 2 names. [employer] and [employer].    MM- January 2017 [name] is a 39 year old male with a history of obstructive sleep apnea on CPAP. He returns today for a compliance download. His download indicates that he uses machine 30 out of 30 days for compliance of 100%. He uses machine greater than 4 hours each night. His residual AHI is 1.2 on a pressure of 14 cm of water with EPR of 3. The patient does not have a significant leak. Patient states that he goes to bed around 9 PM and arises at 5 AM. He denies having to get up at night to use the  restroom. The patient states that he does not go a night without using his CPAP because he sleeps so well with it. The patient's Epworth sleepiness score is 9 and fatigue severity score is 27. He denies any new medical issues. Denies any new neurological symptoms. He returns today for an evaluation.  HISTORY (DOHMEIER): Steve Gutierrez is a 39 y.o. male, seen here as a revisit from Dr. Cyndia Bent for CPAP compliance.  Mr.  Delcid is an established patient of our sleep clinic since 2012. 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  The patient was diagnosed in a split study in 2011 with obstructive sleep apnea. [name] was the referring physician.   On 06-26-2010 the patient had a body mass index of 46. Endorsed the Epworth sleepiness scale at 12 points, Becks depression inventory at 8 points. He had elevated blood pressures prior to the study. His AHI was 66.4 in supine sleep 60.1 and in REM sleep 26.3., the patient's study was split into a CPAP titration. He did best at 14 cm water with an EPR of 2 cm water .  2015 , CD - yearly revisit. Endorsed the fatigue score a 23 points, the Epworth score at 9 points. The last time that days of CPAP he was also reviewed. The patient is still using 14 cm water pressure with an EP are currently at 2 cm water. He has moderate air leaks, the residual AHI of only 1.0. He has excellent compliance with an average time and CPAP therapy daily of 8 hours and 17 minutes. His compliance for the last 100 days of 100%. His sleep time is 9 PM to 5 Am, wakes up spontaneously but has an alarm back up.he sleeps through the night , he falls asleep promptly. He consumes no caffeine, no nicotine, no ETOH use.   01-10-15,  CD yearly RV; 90 days download with 14 cm setting and 3 cm EPR, 8 hours and 7 minutes, 100% compliance for days and over 4 hours of use. Excellent AHI of 1.0/hr.  No new problems comorbidities or recent hospitalizations that would influence his CPAP needs his weight has been stable. On his review of systems he endorsed the fatigue score at 37 and the Epworth Sleepiness Scale at 12 points  REVIEW OF SYSTEMS: Out of a complete 14 system review of symptoms, the patient complains only of the following symptoms, none and all other reviewed systems are negative.  Epworth Sleepiness Scale: 10 Severity scale: 31  ALLERGIES: No Known Allergies  HOME MEDICATIONS: Outpatient Medications Prior to Visit  Medication Sig Dispense Refill  . Ascorbic Acid (VITAMIN C WITH ROSE HIPS) 500 MG tablet Take 500 mg by mouth daily.    . Cholecalciferol (VITAMIN D3) 50 MCG (2000 UT) TABS Take 4,000 Units by mouth daily.    . phentermine 37.5 MG capsule Take 37.5 mg by mouth daily before breakfast.      Facility-Administered Medications Prior to Visit  Medication Dose Route Frequency Provider Last Rate Last Admin  . [START ON 01/10/2020] ceFAZolin (ANCEF) 3 g in dextrose 5 % 50 mL IVPB  3 g Intravenous On Call to OR Terance Hart, MD        PAST MEDICAL HISTORY: Past Medical History:  Diagnosis Date  . Arthritis    per pt L foot on 01/06/20  . 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 Obesity   . OSA (obstructive sleep apnea) 2011   Severe- AHI 66   . OSA on CPAP 03/22/2014    PAST SURGICAL HISTORY: Past Surgical History:  Procedure Laterality Date  . severe fall  02/2002   TBI, COMA    FAMILY HISTORY: History reviewed. No pertinent family history.  SOCIAL HISTORY: Social History   Socioeconomic History  . Marital status:  Married    Spouse name: [name]  . Number of children: 0  . Years of education: Bachelor's  . Highest education level: Not on file  Occupational History  . Not on file  Tobacco Use  . Smoking status: Never Smoker  . Smokeless tobacco: Never Used  Substance and Sexual Activity  . Alcohol use: No  . Drug use: No  . Sexual activity: Not on file  Other Topics Concern  . Not on file  Social History Narrative   Patient is married [name]) and lives with his wife and children.   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    Patient is also a Naval architect.   Patient is right-handed.   Patient does not drink any caffeine.   Patient has a Bachelor's degree.         Social Determinants of Health   Financial Resource Strain:   . Difficulty of Paying Living Expenses: Not on file  Food Insecurity:   . Worried About Programme researcher, broadcasting/film/video in the Last Year: Not on file  . Ran Out of Food in the Last Year: Not on file  Transportation Needs:   . Lack of Transportation (Medical): Not on file  . Lack of Transportation (Non-Medical): Not on file  Physical Activity:   . Days of Exercise per Week: Not on file  . Minutes of Exercise per Session: Not on file  Stress:   . Feeling of Stress : Not on file  Social Connections:   . Frequency of Communication with Friends and Family: Not on file  . Frequency of Social Gatherings with Friends and Family: Not on file  . Attends Religious Services: Not on file  . Active Member of Clubs or Organizations: Not on file  . Attends Banker Meetings: Not on  file  . Marital Status: Not on file  Intimate Partner Violence:   . Fear of Current or Ex-Partner: Not on file  . Emotionally Abused: Not on file  . Physically Abused: Not on file  . Sexually Abused: Not on file      PHYSICAL EXAM  Vitals:   01/09/20 0822  BP: 124/82  Pulse: 83  Temp: (!) 97.2 F (36.2 C)  TempSrc: Oral  Weight: 280 lb (127 kg)  Height: 5\' 10"  (1.778 m)   Body  mass index is 40.18 kg/m.  Generalized: Well developed, in no acute distress  Cardiology: normal rate and rhythm, no murmur noted Respiratory: Clear to auscultation bilaterally Neurological examination  Mentation: Alert oriented to time, place, history taking. Follows all commands speech and language fluent Cranial nerve II-XII: Pupils were equal round reactive to light. Extraocular movements were full, visual field were full  Motor: The motor testing reveals 5 over 5 strength of all 4 extremities. Right > left. Good symmetric motor tone is noted throughout.   Gait and station: Gait is stable but spastic, right foot drop noted    DIAGNOSTIC DATA (LABS, IMAGING, TESTING) - I reviewed patient records, labs, notes, testing and imaging myself where available.  No flowsheet data found.   Lab Results  Component Value Date   WBC 8.7 01/06/2020   HGB 15.2 01/06/2020   HCT 47.0 01/06/2020   MCV 91.4 01/06/2020   PLT 295 01/06/2020      Component Value Date/Time   NA 139 01/06/2020 0831   K 4.2 01/06/2020 0831   CL 103 01/06/2020 0831   CO2 26 01/06/2020 0831   GLUCOSE 93 01/06/2020 0831   BUN 19 01/06/2020 0831   CREATININE 0.80 01/06/2020 0831   CALCIUM 9.4 01/06/2020 0831   GFRNONAA >60 01/06/2020 0831   GFRAA >60 01/06/2020 0831   No results found for: CHOL, HDL, LDLCALC, LDLDIRECT, TRIG, CHOLHDL No results found for: HGBA1C No results found for: VITAMINB12 No results found for: TSH     ASSESSMENT AND PLAN 39 y.o. year old male  has a past medical history of Arthritis, Brain injury ([place]) (2003), Cerebral palsy ([place]), Obesity, OSA (obstructive sleep apnea) (2011), and OSA on CPAP (03/22/2014). here with     ICD-10-CM   1. OSA on CPAP  G47.33    Z99.89     Also has done very well with his new CPAP machine.  Compliance report reveals excellent compliance.  He was encouraged to continue using CPAP nightly and for greater than 4 hours each night. He is scheduled for surgery  for ankle fixation tomorrow. He is hopeful for discharge home following surgery. He will follow up closely with PCP and orthopedics as advised.  He will follow-up in 12 months, sooner if needed.  He verbalizes understanding and agreement with this plan.   No orders of the defined types were placed in this encounter.    No orders of the defined types were placed in this encounter.     I spent 15 minutes with the patient. 50% of this time was spent counseling and educating patient on plan of care and medications.    Debbora Presto, FNP-C 01/09/2020, 8:31 AM [facility] [address], [place], [place] [address] [phone]

## 2020-01-09 ENCOUNTER — Ambulatory Visit: Payer: BC Managed Care – PPO | Admitting: Family Medicine

## 2020-01-09 ENCOUNTER — Other Ambulatory Visit: Payer: Self-pay

## 2020-01-09 ENCOUNTER — Encounter: Payer: Self-pay | Admitting: Family Medicine

## 2020-01-09 VITALS — BP 124/82 | HR 83 | Temp 97.2°F | Ht 70.0 in | Wt 280.0 lb

## 2020-01-09 DIAGNOSIS — Z9989 Dependence on other enabling machines and devices: Secondary | ICD-10-CM | POA: Diagnosis not present

## 2020-01-09 DIAGNOSIS — G4733 Obstructive sleep apnea (adult) (pediatric): Secondary | ICD-10-CM | POA: Diagnosis not present

## 2020-01-09 MED ORDER — DEXTROSE 5 % IV SOLN
3.0000 g | INTRAVENOUS | Status: AC
  Administered 2020-01-10: 3 g via INTRAVENOUS
  Filled 2020-01-09: qty 3000
  Filled 2020-01-09: qty 3

## 2020-01-09 NOTE — Anesthesia Preprocedure Evaluation (Addendum)
Anesthesia Evaluation  Patient identified by MRN, date of birth, ID band Patient awake    Reviewed: Allergy & Precautions, NPO status , Patient's Chart, lab work & pertinent test results  History of Anesthesia Complications Negative for: history of anesthetic complications  Airway Mallampati: III  TM Distance: >3 FB Neck ROM: Full    Dental no notable dental hx. (+) Dental Advisory Given   Pulmonary sleep apnea and Continuous Positive Airway Pressure Ventilation ,    Pulmonary exam normal        Cardiovascular negative cardio ROS Normal cardiovascular exam     Neuro/Psych CP TBI negative neurological ROS     GI/Hepatic negative GI ROS, Neg liver ROS,   Endo/Other  Morbid obesity  Renal/GU negative Renal ROS     Musculoskeletal negative musculoskeletal ROS (+)   Abdominal   Peds  Hematology negative hematology ROS (+)   Anesthesia Other Findings Day of surgery medications reviewed with the patient.  Reproductive/Obstetrics                            Anesthesia Physical Anesthesia Plan  ASA: III  Anesthesia Plan: General   Post-op Pain Management:  Regional for Post-op pain   Induction: Intravenous  PONV Risk Score and Plan: 2 and Ondansetron and Dexamethasone  Airway Management Planned: LMA  Additional Equipment:   Intra-op Plan:   Post-operative Plan: Extubation in OR  Informed Consent: I have reviewed the patients History and Physical, chart, labs and discussed the procedure including the risks, benefits and alternatives for the proposed anesthesia with the patient or authorized representative who has indicated his/her understanding and acceptance.     Dental advisory given  Plan Discussed with: Anesthesiologist and CRNA  Anesthesia Plan Comments:        Anesthesia Quick Evaluation

## 2020-01-10 ENCOUNTER — Ambulatory Visit (HOSPITAL_COMMUNITY)
Admission: RE | Admit: 2020-01-10 | Discharge: 2020-01-10 | Disposition: A | Discharge status: Home / Self Care | Payer: BC Managed Care – PPO | Attending: Orthopaedic Surgery | Admitting: Orthopaedic Surgery

## 2020-01-10 ENCOUNTER — Encounter (HOSPITAL_COMMUNITY): Payer: Self-pay | Admitting: Orthopaedic Surgery

## 2020-01-10 ENCOUNTER — Ambulatory Visit (HOSPITAL_COMMUNITY): Payer: BC Managed Care – PPO

## 2020-01-10 ENCOUNTER — Encounter (HOSPITAL_COMMUNITY)
Admission: RE | Disposition: A | Discharge status: Home / Self Care | Payer: Self-pay | Source: Home / Self Care | Attending: Orthopaedic Surgery

## 2020-01-10 ENCOUNTER — Ambulatory Visit (HOSPITAL_COMMUNITY): Payer: BC Managed Care – PPO | Admitting: Physician Assistant

## 2020-01-10 ENCOUNTER — Other Ambulatory Visit: Payer: Self-pay

## 2020-01-10 DIAGNOSIS — M25772 Osteophyte, left ankle: Secondary | ICD-10-CM | POA: Diagnosis not present

## 2020-01-10 DIAGNOSIS — Z87820 Personal history of traumatic brain injury: Secondary | ICD-10-CM | POA: Insufficient documentation

## 2020-01-10 DIAGNOSIS — Z79899 Other long term (current) drug therapy: Secondary | ICD-10-CM | POA: Diagnosis not present

## 2020-01-10 DIAGNOSIS — Z419 Encounter for procedure for purposes other than remedying health state, unspecified: Secondary | ICD-10-CM

## 2020-01-10 DIAGNOSIS — G4733 Obstructive sleep apnea (adult) (pediatric): Secondary | ICD-10-CM | POA: Insufficient documentation

## 2020-01-10 DIAGNOSIS — M21072 Valgus deformity, not elsewhere classified, left ankle: Secondary | ICD-10-CM | POA: Diagnosis present

## 2020-01-10 DIAGNOSIS — M21372 Foot drop, left foot: Secondary | ICD-10-CM | POA: Insufficient documentation

## 2020-01-10 DIAGNOSIS — G809 Cerebral palsy, unspecified: Secondary | ICD-10-CM | POA: Insufficient documentation

## 2020-01-10 DIAGNOSIS — M722 Plantar fascial fibromatosis: Secondary | ICD-10-CM | POA: Insufficient documentation

## 2020-01-10 DIAGNOSIS — M19072 Primary osteoarthritis, left ankle and foot: Secondary | ICD-10-CM | POA: Diagnosis not present

## 2020-01-10 DIAGNOSIS — Z6839 Body mass index (BMI) 39.0-39.9, adult: Secondary | ICD-10-CM | POA: Insufficient documentation

## 2020-01-10 HISTORY — PX: ACHILLES TENDON LENGTHENING: SHX6455

## 2020-01-10 HISTORY — PX: TENOLYSIS: SHX396

## 2020-01-10 HISTORY — PX: STERIOD INJECTION: SHX5046

## 2020-01-10 HISTORY — PX: FOOT ARTHRODESIS: SHX1655

## 2020-01-10 SURGERY — FUSION, JOINT, FOOT
Anesthesia: General | Site: Foot | Laterality: Right

## 2020-01-10 MED ORDER — VANCOMYCIN HCL 500 MG IV SOLR
INTRAVENOUS | Status: AC
  Filled 2020-01-10: qty 500

## 2020-01-10 MED ORDER — PROMETHAZINE HCL 25 MG/ML IJ SOLN: 6.2500 mg | INTRAMUSCULAR | Status: DC | PRN

## 2020-01-10 MED ORDER — METHYLPREDNISOLONE ACETATE 80 MG/ML IJ SUSP
INTRAMUSCULAR | Status: AC
  Filled 2020-01-10: qty 1

## 2020-01-10 MED ORDER — PHENYLEPHRINE 40 MCG/ML (10ML) SYRINGE FOR IV PUSH (FOR BLOOD PRESSURE SUPPORT)
PREFILLED_SYRINGE | INTRAVENOUS | Status: DC | PRN
  Administered 2020-01-10 (×2): 40 ug via INTRAVENOUS
  Administered 2020-01-10: 80 ug via INTRAVENOUS

## 2020-01-10 MED ORDER — ACETAMINOPHEN 500 MG PO TABS
1000.0000 mg | ORAL_TABLET | Freq: Once | ORAL | Status: AC
  Administered 2020-01-10: 08:00:00 1000 mg via ORAL
  Filled 2020-01-10: qty 2

## 2020-01-10 MED ORDER — CELECOXIB 200 MG PO CAPS
400.0000 mg | ORAL_CAPSULE | Freq: Once | ORAL | Status: AC
  Administered 2020-01-10: 400 mg via ORAL
  Filled 2020-01-10: qty 2

## 2020-01-10 MED ORDER — MIDAZOLAM HCL 5 MG/5ML IJ SOLN
INTRAMUSCULAR | Status: DC | PRN
  Administered 2020-01-10: 1 mg via INTRAVENOUS

## 2020-01-10 MED ORDER — ROCURONIUM BROMIDE 10 MG/ML (PF) SYRINGE
PREFILLED_SYRINGE | INTRAVENOUS | Status: AC
  Filled 2020-01-10: qty 10

## 2020-01-10 MED ORDER — SUCCINYLCHOLINE CHLORIDE 200 MG/10ML IV SOSY
PREFILLED_SYRINGE | INTRAVENOUS | Status: DC | PRN
  Administered 2020-01-10: 200 mg via INTRAVENOUS

## 2020-01-10 MED ORDER — EPHEDRINE SULFATE-NACL 50-0.9 MG/10ML-% IV SOSY
PREFILLED_SYRINGE | INTRAVENOUS | Status: DC | PRN
  Administered 2020-01-10: 5 mg via INTRAVENOUS
  Administered 2020-01-10: 10 mg via INTRAVENOUS

## 2020-01-10 MED ORDER — FENTANYL CITRATE (PF) 100 MCG/2ML IJ SOLN: 25.0000 ug | INTRAMUSCULAR | Status: DC | PRN

## 2020-01-10 MED ORDER — METHYLPREDNISOLONE ACETATE 80 MG/ML IJ SUSP
INTRAMUSCULAR | Status: DC | PRN
  Administered 2020-01-10: 80 mg

## 2020-01-10 MED ORDER — ASPIRIN 325 MG PO TABS: 325.0000 mg | ORAL_TABLET | Freq: Every day | ORAL | 11 refills | Status: AC

## 2020-01-10 MED ORDER — ONDANSETRON HCL 4 MG/2ML IJ SOLN
INTRAMUSCULAR | Status: DC | PRN
  Administered 2020-01-10: 4 mg via INTRAVENOUS

## 2020-01-10 MED ORDER — BUPIVACAINE HCL (PF) 0.5 % IJ SOLN
INTRAMUSCULAR | Status: AC
  Filled 2020-01-10: qty 30

## 2020-01-10 MED ORDER — ONDANSETRON HCL 4 MG/2ML IJ SOLN
INTRAMUSCULAR | Status: AC
  Filled 2020-01-10: qty 2

## 2020-01-10 MED ORDER — FENTANYL CITRATE (PF) 250 MCG/5ML IJ SOLN
INTRAMUSCULAR | Status: AC
  Filled 2020-01-10: qty 5

## 2020-01-10 MED ORDER — BUPIVACAINE HCL (PF) 0.5 % IJ SOLN
INTRAMUSCULAR | Status: DC | PRN
  Administered 2020-01-10: 2 mL

## 2020-01-10 MED ORDER — BUPIVACAINE HCL (PF) 0.25 % IJ SOLN
INTRAMUSCULAR | Status: DC | PRN
  Administered 2020-01-10: 15 mg

## 2020-01-10 MED ORDER — BUPIVACAINE LIPOSOME 1.3 % IJ SUSP
INTRAMUSCULAR | Status: DC | PRN
  Administered 2020-01-10: 10 mg

## 2020-01-10 MED ORDER — DEXAMETHASONE SODIUM PHOSPHATE 10 MG/ML IJ SOLN
INTRAMUSCULAR | Status: AC
  Filled 2020-01-10: qty 1

## 2020-01-10 MED ORDER — PROPOFOL 10 MG/ML IV BOLUS
INTRAVENOUS | Status: DC | PRN
  Administered 2020-01-10 (×2): 200 mg via INTRAVENOUS

## 2020-01-10 MED ORDER — METHOCARBAMOL 500 MG PO TABS: 500.0000 mg | ORAL_TABLET | Freq: Three times a day (TID) | ORAL | 0 refills | Status: AC | PRN

## 2020-01-10 MED ORDER — POVIDONE-IODINE 10 % EX SWAB: 2.0000 "application " | Freq: Once | CUTANEOUS | Status: DC

## 2020-01-10 MED ORDER — FENTANYL CITRATE (PF) 100 MCG/2ML IJ SOLN
INTRAMUSCULAR | Status: AC
  Administered 2020-01-10: 08:00:00 100 ug via INTRAVENOUS
  Filled 2020-01-10: qty 2

## 2020-01-10 MED ORDER — SUGAMMADEX SODIUM 200 MG/2ML IV SOLN
INTRAVENOUS | Status: DC | PRN
  Administered 2020-01-10: 250 mg via INTRAVENOUS

## 2020-01-10 MED ORDER — MIDAZOLAM HCL 2 MG/2ML IJ SOLN
INTRAMUSCULAR | Status: AC
  Administered 2020-01-10: 2 mg via INTRAVENOUS
  Filled 2020-01-10: qty 2

## 2020-01-10 MED ORDER — ROCURONIUM BROMIDE 10 MG/ML (PF) SYRINGE
PREFILLED_SYRINGE | INTRAVENOUS | Status: DC | PRN
  Administered 2020-01-10: 30 mg via INTRAVENOUS
  Administered 2020-01-10: 50 mg via INTRAVENOUS

## 2020-01-10 MED ORDER — 0.9 % SODIUM CHLORIDE (POUR BTL) OPTIME
TOPICAL | Status: DC | PRN
  Administered 2020-01-10 (×2): 1000 mL

## 2020-01-10 MED ORDER — LACTATED RINGERS IV SOLN: INTRAVENOUS | Status: DC

## 2020-01-10 MED ORDER — MIDAZOLAM HCL 2 MG/2ML IJ SOLN
INTRAMUSCULAR | Status: AC
  Filled 2020-01-10: qty 2

## 2020-01-10 MED ORDER — PROPOFOL 10 MG/ML IV BOLUS
INTRAVENOUS | Status: AC
  Filled 2020-01-10: qty 40

## 2020-01-10 MED ORDER — MIDAZOLAM HCL 2 MG/2ML IJ SOLN: 2.0000 mg | Freq: Once | INTRAMUSCULAR | Status: AC

## 2020-01-10 MED ORDER — FENTANYL CITRATE (PF) 250 MCG/5ML IJ SOLN
INTRAMUSCULAR | Status: DC | PRN
  Administered 2020-01-10 (×6): 50 ug via INTRAVENOUS

## 2020-01-10 MED ORDER — PHENYLEPHRINE HCL-NACL 10-0.9 MG/250ML-% IV SOLN
INTRAVENOUS | Status: DC | PRN
  Administered 2020-01-10: 15 ug/min via INTRAVENOUS

## 2020-01-10 MED ORDER — FENTANYL CITRATE (PF) 100 MCG/2ML IJ SOLN: 100.0000 ug | Freq: Once | INTRAMUSCULAR | Status: AC

## 2020-01-10 MED ORDER — SUCCINYLCHOLINE CHLORIDE 200 MG/10ML IV SOSY
PREFILLED_SYRINGE | INTRAVENOUS | Status: AC
  Filled 2020-01-10: qty 10

## 2020-01-10 MED ORDER — DEXAMETHASONE SODIUM PHOSPHATE 10 MG/ML IJ SOLN
INTRAMUSCULAR | Status: DC | PRN
  Administered 2020-01-10: 10 mg via INTRAVENOUS

## 2020-01-10 MED ORDER — OXYCODONE HCL 5 MG PO TABS: 5.0000 mg | ORAL_TABLET | ORAL | 0 refills | Status: AC | PRN

## 2020-01-10 SURGICAL SUPPLY — 81 items
BANDAGE ESMARK 6X9 LF (GAUZE/BANDAGES/DRESSINGS) IMPLANT
BENZOIN TINCTURE PRP APPL 2/3 (GAUZE/BANDAGES/DRESSINGS) IMPLANT
BIT DRILL 3.5 CANN STRL (BIT) ×2 IMPLANT
BIT DRILL 4 MINI HUDSON CANN (BIT) ×2 IMPLANT
BIT DRILL CANNULATED 3MM (DRILL) IMPLANT
BLADE LONG MED 31MMX9MM (MISCELLANEOUS) ×1
BLADE LONG MED 31X9 (MISCELLANEOUS) ×3 IMPLANT
BLADE SURG 15 STRL LF DISP TIS (BLADE) ×4 IMPLANT
BLADE SURG 15 STRL SS (BLADE) ×14
BNDG ELASTIC 4X5.8 VLCR NS LF (GAUZE/BANDAGES/DRESSINGS) ×2 IMPLANT
BNDG ELASTIC 6X10 VLCR STRL LF (GAUZE/BANDAGES/DRESSINGS) ×4 IMPLANT
BNDG ELASTIC 6X5.8 VLCR STR LF (GAUZE/BANDAGES/DRESSINGS) ×2 IMPLANT
BNDG ESMARK 6X9 LF (GAUZE/BANDAGES/DRESSINGS)
BNDG GAUZE ELAST 4 BULKY (GAUZE/BANDAGES/DRESSINGS) ×2 IMPLANT
BNDG PLASTER X FAST 4X5 WHT LF (CAST SUPPLIES) ×2 IMPLANT
BONE CANC CHIPS 20CC PCAN1/4 (Bone Implant) ×4 IMPLANT
CHIPS CANC BONE 20CC PCAN1/4 (Bone Implant) ×2 IMPLANT
CHLORAPREP W/TINT 26 (MISCELLANEOUS) ×4 IMPLANT
CLOSURE WOUND 1/2 X4 (GAUZE/BANDAGES/DRESSINGS)
COVER SURGICAL LIGHT HANDLE (MISCELLANEOUS) ×8 IMPLANT
COVER WAND RF STERILE (DRAPES) IMPLANT
CUFF TOURN SGL QUICK 34 (TOURNIQUET CUFF) ×2
CUFF TRNQT CYL 34X4.125X (TOURNIQUET CUFF) ×2 IMPLANT
DECANTER SPIKE VIAL GLASS SM (MISCELLANEOUS) IMPLANT
DRAPE C-ARM 42X72 X-RAY (DRAPES) ×4 IMPLANT
DRAPE C-ARM MINI (DRAPES) ×4 IMPLANT
DRAPE C-ARMOR (DRAPES) ×4 IMPLANT
DRAPE EXTREMITY T 121X128X90 (DISPOSABLE) ×4 IMPLANT
DRAPE IMP U-DRAPE 54X76 (DRAPES) ×4 IMPLANT
DRAPE OEC MINIVIEW 54X84 (DRAPES) ×4 IMPLANT
DRAPE U-SHAPE 47X51 STRL (DRAPES) ×4 IMPLANT
DRILL CANNULATED 3MM (DRILL) ×4
DRSG PAD ABDOMINAL 8X10 ST (GAUZE/BANDAGES/DRESSINGS) IMPLANT
ELECT REM PT RETURN 9FT ADLT (ELECTROSURGICAL) ×4
ELECTRODE REM PT RTRN 9FT ADLT (ELECTROSURGICAL) ×2 IMPLANT
GAUZE SPONGE 4X4 12PLY STRL (GAUZE/BANDAGES/DRESSINGS) ×4 IMPLANT
GAUZE SPONGE 4X4 12PLY STRL LF (GAUZE/BANDAGES/DRESSINGS) ×4 IMPLANT
GAUZE XEROFORM 5X9 LF (GAUZE/BANDAGES/DRESSINGS) ×2 IMPLANT
GLOVE BIOGEL M STRL SZ7.5 (GLOVE) ×10 IMPLANT
GLOVE BIOGEL PI IND STRL 8 (GLOVE) ×2 IMPLANT
GLOVE BIOGEL PI INDICATOR 8 (GLOVE) ×2
GOWN STRL REUS W/ TWL LRG LVL3 (GOWN DISPOSABLE) ×2 IMPLANT
GOWN STRL REUS W/ TWL XL LVL3 (GOWN DISPOSABLE) ×2 IMPLANT
GOWN STRL REUS W/TWL LRG LVL3 (GOWN DISPOSABLE) ×2
GOWN STRL REUS W/TWL XL LVL3 (GOWN DISPOSABLE) ×2
GRAFT BNE CANC CHIPS 1-8 20CC (Bone Implant) IMPLANT
GUIDEWIRE 1.6 (WIRE) ×6
GUIDEWIRE ORTH 157X1.6XTROC (WIRE) IMPLANT
GUIDEWIRE W/TROCAR NT 12IN (WIRE) ×6 IMPLANT
KIT BASIN OR (CUSTOM PROCEDURE TRAY) ×4 IMPLANT
NDL TAPERED W/ NITINOL LOOP (MISCELLANEOUS) ×2 IMPLANT
NEEDLE TAPERED W/ NITINOL LOOP (MISCELLANEOUS) ×4 IMPLANT
NS IRRIG 1000ML POUR BTL (IV SOLUTION) ×4 IMPLANT
PACK ORTHO EXTREMITY (CUSTOM PROCEDURE TRAY) ×4 IMPLANT
PAD ABD 8X10 STRL (GAUZE/BANDAGES/DRESSINGS) ×4 IMPLANT
PAD CAST 4YDX4 CTTN HI CHSV (CAST SUPPLIES) ×2 IMPLANT
PADDING CAST COTTON 4X4 STRL (CAST SUPPLIES) ×2
PADDING CAST SYNTHETIC 4 (CAST SUPPLIES) ×2
PADDING CAST SYNTHETIC 4X4 STR (CAST SUPPLIES) ×2 IMPLANT
SCREW CANN 6.7X55 18 THD SD (Screw) ×2 IMPLANT
SCREW CANN THRD SD 6.7X85 (Screw) ×2 IMPLANT
SCREW CANN TI PT/THRD 4.5X45 (Screw) ×2 IMPLANT
SCREW LOW PRO TITANIUM 6.7X90 (Screw) ×2 IMPLANT
SCREW LP CANN PT 4.5X50 (Screw) ×4 IMPLANT
SCREW LP TI 6.7X50M CANN 18THR (Screw) ×2 IMPLANT
SPONGE LAP 18X18 RF (DISPOSABLE) IMPLANT
SPONGE LAP 18X18 X RAY DECT (DISPOSABLE) ×2 IMPLANT
STAPLE SUPERMX NITI 20X20 (Staple) ×4 IMPLANT
STRIP CLOSURE SKIN 1/2X4 (GAUZE/BANDAGES/DRESSINGS) IMPLANT
SUCTION FRAZIER HANDLE 10FR (MISCELLANEOUS) ×2
SUCTION TUBE FRAZIER 10FR DISP (MISCELLANEOUS) ×2 IMPLANT
SUT ETHILON 3 0 PS 1 (SUTURE) ×10 IMPLANT
SUT MNCRL AB 3-0 PS2 18 (SUTURE) ×12 IMPLANT
SUT PDS AB 2-0 CT2 27 (SUTURE) ×8 IMPLANT
SUT VIC AB 0 CT1 27 (SUTURE) ×2
SUT VIC AB 0 CT1 27XBRD ANBCTR (SUTURE) IMPLANT
SYR CONTROL 10ML LL (SYRINGE) ×2 IMPLANT
TOWEL GREEN STERILE FF (TOWEL DISPOSABLE) ×8 IMPLANT
TUBE CONNECTING 20'X1/4 (TUBING) ×2
TUBE CONNECTING 20X1/4 (TUBING) ×6 IMPLANT
UNDERPAD 30X30 (UNDERPADS AND DIAPERS) ×4 IMPLANT

## 2020-01-10 NOTE — Anesthesia Procedure Notes (Signed)
Anesthesia Regional Block: Popliteal block   Pre-Anesthetic Checklist: ,, timeout performed, Correct Patient, Correct Site, Correct Laterality, Correct Procedure, Correct Position, site marked, Risks and benefits discussed,  Surgical consent,  Pre-op evaluation,  At surgeon's request and post-op pain management  Laterality: Left  Prep: chloraprep       Needles:  Injection technique: Single-shot  Needle Type: Echogenic Stimulator Needle          Additional Needles:   Narrative:  Start time: 01/10/2020 8:06 AM End time: 01/10/2020 8:16 AM Injection made incrementally with aspirations every 5 mL.  Performed by: Personally  Anesthesiologist: Heather Roberts, MD  Additional Notes: A functioning IV was confirmed and monitors were applied.  Sterile prep and drape, hand hygiene and sterile gloves were used.  Negative aspiration and test dose prior to incremental administration of local anesthetic. The patient tolerated the procedure well.Ultrasound  guidance: relevant anatomy identified, needle position confirmed, local anesthetic spread visualized around nerve(s), vascular puncture avoided.  Image printed for medical record.

## 2020-01-10 NOTE — Anesthesia Procedure Notes (Signed)
Procedure Name: LMA Insertion Date/Time: 01/10/2020 9:10 AM Performed by: Waynard Edwards, CRNA Pre-anesthesia Checklist: Patient identified, Emergency Drugs available, Suction available and Patient being monitored Patient Re-evaluated:Patient Re-evaluated prior to induction Oxygen Delivery Method: Circle system utilized Preoxygenation: Pre-oxygenation with 100% oxygen Induction Type: IV induction Ventilation: Mask ventilation without difficulty LMA: LMA inserted LMA Size: 4.0 Number of attempts: 1 Placement Confirmation: positive ETCO2 and breath sounds checked- equal and bilateral Tube secured with: Tape Dental Injury: Teeth and Oropharynx as per pre-operative assessment

## 2020-01-10 NOTE — Op Note (Signed)
Steve Gutierrez male 39 y.o. 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 osteophyte and talar neck osteophyte Right foot plantar fasciitis  PostOperative Diagnosis: Same  PROCEDURE: Left triple arthrodesis Left tendo Achilles lengthening Ankle arthrotomy and removal of loose bodies Partial excision of left tibia Partial excision of left talus Right plantar fascia injection of Depo-Medrol  SURGEON: Dub Mikes, MD  ASSISTANT: Albin Felling, RNFA  ANESTHESIA: General endotracheal tube with peripheral nerve blockade  FINDINGS: 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 male with a history of traumatic brain injury and left-sided foot drop had continued progressive pes planovalgus with large osteophyte formation about the talonavicular joint and ankle joint.  He had worsening pain with regard to his subtalar joint talonavicular joint calcaneocuboid joint was unable to perform activities of daily living well.  He had been using an AFO brace for his foot drop deformity.  CT scan demonstrated the above arthritic joints and he was indicated for the above surgeries.  He understood the risk benefits alternatives of surgery which include but not limited to wound healing complications, infection, nonunion, malunion, need for further surgery, damage to surrounding structures and continued pain.  We discussed that his triple arthrodesis would not be addressing the foot  drop and that he would likely continue to need the AFO brace.  He did have significant amount of equinus contracture on exam.  In the preoperative area he asked for injection in his plantar fashion on the right side as he had been developing some significant pain in this due to offloading the left foot.  This was reasonable.  PROCEDURE:Patient was identified the preoperative holding area.  The left leg was marked myself.  Consent was signed myself and the patient.  Peripheral nerve block was performed by anesthesia.  Patient was taken to the operative suite and placed supine on the operative table.  General anesthesia was induced out difficulty.  The patient originally had an LMA placed however prior to prepping the leg the patient began vomiting.  There was a clear vomit that was thought by the anesthesiologist to be his preoperative clear liquid beverage.  There was no obvious aspiration event.  He was then converted to endotracheal tube.  Once he had normalization of his saturations and the anesthesiologist were confident he was being well ventilated we proceeded.  Preoperative antibiotics were given.  Thigh tourniquet was placed on the left thigh and a bump was placed under the left hip. Bone foam was used.  All bony prominences well-padded.  Surgical timeout was performed.    We began by sterilizing the plantar medial aspect of the right calcaneus with alcohol.  Then 2 cc of half percent Marcaine plain and 1 cc of Depo-Medrol was injected into the plantar fascial area at the plantar calcaneus without difficulty.  He tolerated this well.  A Band-Aid was placed.   Then the left leg operative extremity was then elevated and the tourniquet inflated to 300 mmHg.  We elevated the leg and performed a tendo Achilles lengthening and a trihemisection fashion.  This was done medial, lateral than medial.  There was improvement in his equinus contracture  after this.  This was done with an 11 blade through  percutaneous incisions.  We then began by making a sinus Tarsi type incision from the tip of the fibula down the length of the first ray.  This taken sharply down through skin and subcutaneous tissue.  Skin bleeders were Bovie cauterized.  We then identified the subtalar joint through palpation and the peroneal tendons.  The sheath surrounding the peroneal tendons was identified and opened.  There is significant amount of tendinosis and fluid within the peroneal tendons.  The peroneal tendons were retracted and the sinus Tarsi was entered sharply.  The subtalar joint was identified.  There is significant mount of lateral subluxation of the calcaneus under the talus.  There is a large rush of synovial fluid.  We then extended the incision distally and identified the extensor digitorum brevis muscle belly.  There was evidence of fatty infiltration of the muscle.  It was elevated overlying the calcaneocuboid joint and this was identified and mobilized.  Then using a lamina spreader the subtalar joint was extended.  The soft tissue and synovial tissue within the subtalar joint was removed sharply with 15 blade and rondure.  Then using a curette the remaining amount of cartilage that was on the plantar aspect of the talus and the dorsal aspect of the calcaneus within the middle, posterior and anterior facets was removed.  The joint was further mobilized and inspected.  Then the joint was trephinated with a 3.0 mm drill to allow for cancellous bony contact for fusion.  We then turned our attention to the calcaneocuboid joint.  This was opened and released using a osteotome.  Then the lamina spreader was placed and the cartilage was denuded on the anterior process of the calcaneus as well as the cuboid within the joint.  All cartilage surfaces were removed.  This was inspected and irrigated.  We then trephinated both the cuboid and the calcaneus using a drill to allow for good cancellous bony contact for fusion.  We then  turned our attention to the talonavicular joint.  An anterior ankle and dorsal medial incision was created just medial to the tibialis anterior tendon.  This taken sharply down through skin and subcutaneous tissue.  The extensor retinacular tissue was identified and skin bleeders were Bovie cauterized.  Sharply the extensor retinacular tissue was opened up and the tibialis anterior tendon was mobilized and retracted.  Then the incision was taken sharply down to bone.  The ankle joint was identified.  This was opened sharply with a 15 blade creating an ankle arthrotomy.  The capsular tissue on the anterior portion of the ankle joint was mobilized laterally medially giving visualization to the ankle joint.  There is a large amount of bone spur on the distal anterior aspect of the tibia.  There is also bone spurring of the talar neck causing impingement type symptoms.  The soft tissue in this area was debrided back sharply with 15 blade.  Then the incision was extended distally overlying the talonavicular joint.  The incision was taken sharply down to bone after protecting the tibialis anterior tendon and the soft tissues laterally.  The talonavicular joint was identified and there was large osteophytes.  Using a rondure partial excision of the talus was performed given the large amount of osteophytes persist between the talar head and the talar dome that was impinging with the tibia.  Then after this was smoothed out fluoroscopy confirmed adequate bony resection.  Then a osteotome was  used to remove portion of the anterior distal tibia to reduce impingement.  These bony fragments were removed with a rondure.  Then loose bodies within the ankle joint were removed.  The joint was irrigated.  We then turned attention back to the talonavicular joint.  The dorsal aspect of the navicular was removed of bone spur.  There is also a large bone spur on the medial aspect dorsally that was removed and smooth.  He did have an os  naviculare which was not addressed during this procedure.  Then the talonavicular joint was mobilized using an osteotome and the capsular tissue was incised.  We then used a curette to remove all the cartilaginous surfaces from the talar head and the proximal navicular.  Once adequate cartilage removal was performed the joint surfaces were trephinated with a drill.  We then turned our attention back to the subtalar joint.  Using manual manipulation the subtalar joint was reduced into a better more neutral position with slight valgus.  This was then held provisionally with K wire fixation.  Fluoroscopy confirmed appropriate position of the subtalar joint.  Then bone graft in the form of cancellous bone chips were placed within the subtalar joint impact.  Then 2 6.7 mm partially-threaded cannulated screws were placed across the subtalar joint from the talar neck down into the body of the calcaneus.  This provided good purchase and compression across the joint.  Then we turned our attention back to the talonavicular joint.  Cancellus bone chips were placed within the joint.  The talonavicular joint was was reduced into an acceptable position and a abduction and plantar flex position and held provisionally with K wires.  Then 3 4.5 mm partially-threaded cannulated screws were placed across the talonavicular joint stabilizing this and achieving some compression.  We then turned our attention to the calcaneocuboid joint.  There was good bony apposition of the calcaneocuboid joint.  Using a dorsal directed force the calcaneocuboid joint was reduced and held provisionally.  Then 2 20 mm staples were placed across the talonavicular joint providing good compression and stability.  After this fluoroscopic images confirmed appropriate screw length and position.  The position of the foot was acceptable given the amount of deformity that he had preoperatively.  The wounds were irrigated copiously with normal saline.  The  tourniquet was released prior to fixation of the talonavicular joint and calcaneocuboid joint with a tourniquet time of 127 minutes.  The deep tissue and retinacular tissue was closed with a 2-0 PDS within both incisions.  The subcuticular tissue was closed with 3-0 Monocryl and the skin with 3-0 nylon.  He was placed in a soft dressing including Xeroform, 4 x 4's, ABD pads and sterile she cotton.  A short leg nonweightbearing splint was placed it was well-padded.  He tolerated this well.  Aside from the preprep anesthesia complications there was no surgical complications.  He was awakened from anesthesia and extubated.  He was taken to recovery in stable condition.  POST OPERATIVE INSTRUCTIONS: Nonweightbearing the left lower extremity. Right lower extremity weightbearing as tolerated Keep splint dry Follow-up in 2 weeks for nonweightbearing x-rays of the left foot. Call the office with concerns Take 1 325 mg aspirin daily for DVT prophylaxis  TOURNIQUET TIME: 127 minutes  BLOOD LOSS:  less than 100 mL         DRAINS: none         SPECIMEN: none       COMPLICATIONS: Patient vomited with LMA  in place.  No obvious aspiration event.  Transition to endotracheal tube.         Disposition: PACU - hemodynamically stable.         Condition: stable

## 2020-01-10 NOTE — Transfer of Care (Signed)
Immediate Anesthesia Transfer of Care Note  Patient: Steve Gutierrez  Procedure(s) Performed: LEFT FOOT TRIPLE ARTHRODESIS, TENDO ACHILLES LENGTHENING, ANKLE ARTHROTOMY AND DEBRIDEMENT RESECTION OF TIBIA AND TALUS (Left Foot) TENDON SHEATH RELEASE/TENOLYSIS (Left Foot) ACHILLES TENDON LENGTHENING (Left Foot) Steroid Injection (Right Foot)  Patient Location: PACU  Anesthesia Type:General  Level of Consciousness: drowsy and patient cooperative  Airway & Oxygen Therapy: Patient Spontanous Breathing and Patient connected to face mask oxygen  Post-op Assessment: Report given to RN and Post -op Vital signs reviewed and stable  Post vital signs: Reviewed and stable  Last Vitals:  Vitals Value Taken Time  BP 127/67 01/10/20 1329  Temp    Pulse 83 01/10/20 1332  Resp 24 01/10/20 1332  SpO2 97 % 01/10/20 1332  Vitals shown include unvalidated device data.  Last Pain:  Vitals:   01/10/20 0736  TempSrc:   PainSc: 4          Complications: No apparent anesthesia complications

## 2020-01-10 NOTE — Discharge Instructions (Signed)
DR. Sentoria [facility] FOOT [facility] ANK[facility] SURGERY POST-OP INSTRUCTIONS   Pain Management 1. The numbing medicine and your leg will last around 18 hours, take a dose of your pain medicine as soon as you feel it wearing off to avoid rebound pain. 2. Keep your foot elevated above heart level.  Make sure that your heel hangs free ('floats'). 3. Take all prescribed medication as directed. 4. If taking narcotic pain medication you may want to use an over-the-counter stool softener to avoid constipation. 5. You may take over-the-counter NSAIDs (ibuprofen, naproxen, etc.) as well as over-the-counter acetaminophen as directed on the packaging as a supplement for your pain and may also use it to wean away from the prescription medication.  Activity ? Non-weightbearing ? Keep splint intact  First Postoperative Visit 1. Your first postop visit will be at least 2 weeks after surgery.  This should be scheduled when you schedule surgery. 2. If you do not have a postoperative visit scheduled please call [phone] to schedule an appointment. 3. At the appointment your incision will be evaluated for suture removal, x-rays will be obtained if necessary.  General Instructions 1. Swelling is very common after foot and ankle surgery.  It often takes 3 months for the foot and ankle to begin to feel comfortable.  Some amount of swelling will persist for 6-12 months. 2. DO NOT change the dressing.  If there is a problem with the dressing (too tight, loose, gets wet, etc.) please contact Dr. Manvi Guilliams's office. 3. DO NOT get the dressing wet.  For showers you can use an over-the-counter cast cover or wrap a washcloth around the top of your dressing and then cover it with a plastic bag and tape it to your leg. 4. DO NOT soak the incision (no tubs, pools, bath, etc.) until you have approval from Dr. Hinton Luellen.  Contact Dr. Adairs office or go to Emergency Room if: 1. Temperature above 101 F. 2. Increasing pain that is unresponsive to pain  medication or elevation 3. Excessive redness or swelling in your foot 4. Dressing problems - excessive bloody drainage, looseness or tightness, or if dressing gets wet 5. Develop pain, swelling, warmth, or discoloration of your calf  

## 2020-01-10 NOTE — H&P (Signed)
Steve Gutierrez is an 39 y.o. male.   Chief Complaint: Left foot drop with hindfoot valgus and subtalar, talonavicular and calcaneocuboid osteoarthritis, right plantar fasciitis HPI: Steve Gutierrez is a 39 year old male here for triple arthrodesis and correction of his pes planovalgus.  He has had continued pain in his left foot and ankle for several years.  He has a traumatic brain injury with left-sided hemiplegia.  He has a foot drop and wears an AFO brace.  He is limited activities due to the discomfort.  CT scan revealed significant arthritis within the subtalar joint, talonavicular joint and the calcaneocuboid joint.  Given the foot position and continued arthritis he was indicated for triple arthrodesis.  He also has large bony osteophytes of the distal anterior tibia and talar neck region causing bony impingement of his ankle.  We will plan for resection of these.  He also has equinus contracture.  He understands that the triple arthrodesis will not allow for increased ankle dorsiflexion strength but will hopefully relieve his discomfort and allow him to perform activities without so much pain.  He also has been complaining of some right plantar heel pain is requesting steroid injection in this area which we will perform.  Past Medical History:  Diagnosis Date  . Arthritis    per pt L foot on 01/06/20  . 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 Obesity   . OSA (obstructive sleep apnea) 2011   Severe- AHI 64   . OSA on CPAP 03/22/2014    Past Surgical History:  Procedure Laterality Date  . severe fall  02/2002   TBI, COMA    History reviewed. No pertinent family history. Social History:  reports that he has never smoked. He has never used smokeless tobacco. He reports that he does not drink alcohol or use drugs.  Allergies: No Known  Allergies  Medications Prior to Admission  Medication Sig Dispense Refill  . Ascorbic Acid (VITAMIN C WITH ROSE HIPS) 500 MG tablet Take 500 mg by mouth daily.    . Cholecalciferol (VITAMIN D3) 50 MCG (2000 UT) TABS Take 4,000 Units by mouth daily.    . phentermine 37.5 MG capsule Take 37.5 mg by mouth daily before breakfast.       No results found for this or any previous visit (from the past 48 hour(s)). No results found.  Review of Systems  Constitutional: Negative.   HENT: Negative.   Eyes: Negative.   Respiratory: Negative.   Cardiovascular: Negative.   Gastrointestinal: Negative.   Endocrine: Negative.   Musculoskeletal:       Left-sided hemiplegia Left ankle pain and swelling  Neurological:       Left-sided hemiplegia with left-sided foot drop  Psychiatric/Behavioral: Negative.     Blood pressure 120/60, pulse 66, temperature 98.3 F (36.8 C), temperature source Oral, resp. rate 16, height 5\' 10"  (1.778 m), weight 124.3 kg, SpO2 98 %. Physical Exam  Constitutional: He appears well-developed.  HENT:  Head: Normocephalic.  Eyes: Conjunctivae are normal.  Cardiovascular: Normal rate.  Respiratory: Effort normal.  GI: Soft.  Musculoskeletal:     Cervical back: Neck supple.     Comments: Left ankle and foot demonstrate swelling.  He has a valgus deformity through the hindfoot and abduction through the forefoot.  He has tenderness palpation about that area.  He has weak ankle dorsiflexion.  He has weak hindfoot eversion.  Sensation grossly intact to light touch on dorsal plantar foot.  Palpable dorsalis pedis pulse.  Equinus contracture.  Right foot demonstrates tenderness palpation on the plantar heel at the origin of the plantar fascia.  No swelling.  Neurological: He is alert.  Skin: Skin is warm.  Psychiatric: He has a normal mood and affect.     Assessment/Plan We will proceed with triple arthrodesis, tendo Achilles lengthening and resection of osteophytes of  the talus and distal tibia within the ankle joint.  This will be through an ankle arthrotomy.  We will also perform an injection of the plantar fascia on the right side.  He understands the risk benefits alternatives surgery which include but not limited to wound healing complications, infection, nonunion, malunion, need for further surgery and continued pain.  He understands that this will likely not allow him to wean the AFO brace but he is okay with that.  He understands the perioperative and anesthetic risk which include death.  He agrees to comply with the weightbearing restrictions.  Terance Hart, MD 01/10/2020, 8:53 AM

## 2020-01-10 NOTE — Anesthesia Postprocedure Evaluation (Signed)
Anesthesia Post Note  Patient: Steve Gutierrez  Procedure(s) Performed: LEFT FOOT TRIPLE ARTHRODESIS, TENDO ACHILLES LENGTHENING, ANKLE ARTHROTOMY AND DEBRIDEMENT RESECTION OF TIBIA AND TALUS (Left Foot) TENDON SHEATH RELEASE/TENOLYSIS (Left Foot) ACHILLES TENDON LENGTHENING (Left Foot) Steroid Injection (Right Foot)     Patient location during evaluation: PACU Anesthesia Type: General Level of consciousness: sedated Pain management: pain level controlled Vital Signs Assessment: post-procedure vital signs reviewed and stable Respiratory status: spontaneous breathing and respiratory function stable Cardiovascular status: stable Postop Assessment: no apparent nausea or vomiting Anesthetic complications: no    Last Vitals:  Vitals:   01/10/20 1406 01/10/20 1408  BP:  124/83  Pulse: 82 82  Resp: (!) 23 (!) 21  Temp:  36.8 C  SpO2: 93% 94%    Last Pain:  Vitals:   01/10/20 1408  TempSrc:   PainSc: 0-No pain                 Malka Bocek DANIEL

## 2020-01-10 NOTE — Anesthesia Procedure Notes (Addendum)
Procedure Name: Intubation Date/Time: 01/10/2020 9:22 AM Performed by: Heather Roberts, MD Pre-anesthesia Checklist: Emergency Drugs available, Suction available, Patient identified and Patient being monitored Patient Re-evaluated:Patient Re-evaluated prior to induction Oxygen Delivery Method: Circle system utilized Preoxygenation: Pre-oxygenation with 100% oxygen Induction Type: IV induction and Rapid sequence Ventilation: Mask ventilation with difficulty, Oral airway inserted - appropriate to patient size and Two handed mask ventilation required Laryngoscope Size: Miller and 2 Grade View: Grade I Tube type: Oral Tube size: 7.5 mm Number of attempts: 1 Airway Equipment and Method: Stylet Placement Confirmation: ETT inserted through vocal cords under direct vision,  positive ETCO2 and breath sounds checked- equal and bilateral Secured at: 23 cm Tube secured with: Tape Dental Injury: Teeth and Oropharynx as per pre-operative assessment

## 2020-01-12 ENCOUNTER — Encounter: Payer: Self-pay | Admitting: *Deleted

## 2021-01-07 ENCOUNTER — Ambulatory Visit: Payer: BC Managed Care – PPO | Admitting: Neurology

## 2021-01-14 ENCOUNTER — Other Ambulatory Visit: Payer: Self-pay

## 2021-01-14 ENCOUNTER — Ambulatory Visit: Payer: BC Managed Care – PPO | Admitting: Neurology

## 2021-01-14 ENCOUNTER — Encounter: Payer: Self-pay | Admitting: Neurology

## 2021-01-14 VITALS — BP 128/84 | HR 83 | Ht 70.0 in | Wt 297.0 lb

## 2021-01-14 DIAGNOSIS — G4733 Obstructive sleep apnea (adult) (pediatric): Secondary | ICD-10-CM | POA: Diagnosis not present

## 2021-01-14 DIAGNOSIS — G808 Other cerebral palsy: Secondary | ICD-10-CM

## 2021-01-14 DIAGNOSIS — S069X5S Unspecified intracranial injury with loss of consciousness greater than 24 hours with return to pre-existing conscious level, sequela: Secondary | ICD-10-CM

## 2021-01-14 DIAGNOSIS — Z9989 Dependence on other enabling machines and devices: Secondary | ICD-10-CM | POA: Diagnosis not present

## 2021-01-14 DIAGNOSIS — M05772 Rheumatoid arthritis with rheumatoid factor of left ankle and foot without organ or systems involvement: Secondary | ICD-10-CM

## 2021-01-14 DIAGNOSIS — S069X9S Unspecified intracranial injury with loss of consciousness of unspecified duration, sequela: Secondary | ICD-10-CM

## 2021-01-14 NOTE — Progress Notes (Signed)
SLEEP MEDICINE CLINIC    PATIENT: Steve Gutierrez DOB: 03-17-1981  REASON FOR VISIT: follow up- OSA  HISTORY FROM: patient  INTERVAL HISTORY :  [name] is a 40 year old established CPAP user in our practice who is here for his yearly revisit.  He is using an air sense 10 machine AutoSet with a minimum pressure of 8 maximum pressure of 16 and an EPR level of 3 cmH2O, his residual AHI is 1.4 which is an excellent resolution.  He does have moderate air leakage, his compliance has been 100% by days and time with an average of 8 hours 6 minutes.  This is an air sense 10 machine that has replaced his previously used S9 machine.  It is relatively new.  Based on the air leak data I think that he has a very good mask fit.  He endorsed the Epworth Sleepiness Scale at 12 points out of 24 and the fatigue severity at 28, he did endorse 7 points on the  depression score and he relates that this is due to work stressors. 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  He is working on his master's degree. MBA.   [name] is an established patient of our sleep clinic since 2012. He has left sided Hemiparesis- he was born with cerebral palsy , but this only very mildly impaired him, he suffered a severely disabling head injury and fracture to his arm and shoulder later at age 59 and this has caused him to have significant plegia in the left arm- he is right-hand dominant.  The patient was diagnosed in a split study in 2011 with obstructive sleep apnea. [name] was the referring physician.     Interval history for Steve Gutierrez, a meanwhile 40 year old patient on CPAP. [name] has been a highly compliant CPAP user again, his average user time is about 8 hours nightly he is using CPAP S9 AutoSet which is over 42 years old male, set at 14 cm pressure with 3 cm EPR and his residual AHI is 1.2 which speaks for an excellent resolution.   There have been high air leaks but they have not let to increased AHI is. Its time for a new machine.    Last visit:  year 40 year old Caucasian male patient with Left hemiplegia.  Steve Gutierrez  had no new medical developments over the last 12 months, no hospitalizations.  He has stayed a very highly compliant CPAP user with an average user time of 7 hours 43 minutes, 100% compliance by days and time with a CPAP that is set at 14 cm water pressure with 3 cm EPR.  He does have moderate air leaks his residual AHI is 0.6/h, no central apneas are emerging and his air leak speak occasionally on weekends but the average  Work- and week-day air leak is under 15 L.  100% compliance.   Interval history from 11 January 2018, Steve Gutierrez is seen here today for his yearly revisit on CPAP compliance.  He continues to be 100% compliant patient with 7 hours 43 minutes average daily use, CPAP is still set to 14 cmH2O with 3 cm EPR and his residual AHI 0.7 apneas per hour no central apneas emerging, he had does have high air leaks but they seem not to influence the apnea count. He keeps tightening his mask- and the trend is going down (  looking at the graph). He uses a FFM Airfit F 20 in large size.      Steve Gutierrez is here for his yearly revisit it has again been 100% compliant patient with an average user time of 7 hours 57 minutes, CPAP is set at 14 cm water with 3 cm EPR and his residual AHI is 0.9. He does have some significant air leaks but they have not reduced the apnea control. He stated he loves his CPAP machine and he feels so much better using it. He also endorses a very low fatigue score at 28 points, and the sleepiness Epworth score at only 5 points. He needs a new interface. He works with [facility], through a friend - now he has to order online or by phone and is frustrated. He didn't know if there is another company out there. I gave him 2 names. [employer] and [employer].    [name] is a 40 year old  male with a history of obstructive sleep apnea on CPAP. He returns today for a compliance download. His download indicates that he uses machine 30 out of 30 days for compliance of 100%. He uses machine greater than 4 hours each night. His residual AHI is 1.2 on a pressure of 14 cm of water with EPR of 3. The patient does not have a significant leak. Patient states that he goes to bed around 9 PM and arises at 5 AM. He denies having to get up at night to use the restroom. The patient states that he does not go a night without using his CPAP because he sleeps so well with it. The patient's Epworth sleepiness score is 9 and fatigue severity score is 27. He denies any new medical issues. Denies any new neurological symptoms. He returns today for an evaluation.  HISTORY ([name]): Steve Gutierrez is a 40 y.o. male, seen here as a revisit from Dr. Cyndia Bent for CPAP compliance.  [name] is an established patient of our sleep clinic since 2012. 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  The patient was diagnosed in a split study in 2011 with obstructive sleep apnea. [name] was the referring physician.  On 06-26-2010 the patient had a body mass index of 46. Endorsed the Epworth sleepiness scale at 12 points, Becks depression inventory at 8 points. He had elevated blood pressures prior to the study. His AHI was 66.4 in supine sleep 60.1 and in REM sleep 26.3., the patient's study was split into a CPAP titration. He did best at 14 cm water with an EPR of 2 cm water .  2015 , CD - yearly revisit. Endorsed the fatigue score a 23 points, the Epworth score at 9 points. The last time that days of CPAP he was also reviewed. The patient is still using 14 cm  water pressure with an EP are currently at 2 cm water. He has moderate air leaks, the residual AHI of only 1.0. He has excellent compliance with an average time and CPAP therapy daily of 8 hours and 17 minutes. His compliance for the last 100 days of 100%. His sleep time is 9 PM to 5 Am, wakes up spontaneously but has an alarm back  up.he sleeps through the night , he falls asleep promptly. He consumes no caffeine, no nicotine, no ETOH use.   01-10-15, CD yearly RV; 90 days download with 14 cm setting and 3 cm EPR, 8 hours and 7 minutes, 100% compliance for days and over 4 hours of use. Excellent AHI of 1.0/hr.  No new problems comorbidities or recent hospitalizations that would influence his CPAP needs his weight has been stable. On his review of systems he endorsed the fatigue score at 37 and the Epworth Sleepiness Scale at 12 points  REVIEW OF SYSTEMS: Out of a complete 14 system review of symptoms, the patient complains only of the following symptoms, and all other reviewed systems are negative.  He is not snoring through the CPAP- has airl-eaks only on weekends but is unsure why, FFM user, no nocturia. Average sleep time over 7 hours. 100% compliance of user time and 30/30  Days.  No naps in daytime.  No weight gain, he is losing a bit.  Allergic rhinitis - congested , takes mucinex.  See history of present illness  ALLERGIES: No Known Allergies  HOME MEDICATIONS: Outpatient Medications Prior to Visit  Medication Sig Dispense Refill  . Ascorbic Acid (VITAMIN C WITH ROSE HIPS) 500 MG tablet Take 500 mg by mouth daily.    . Cholecalciferol (VITAMIN D3) 50 MCG (2000 UT) TABS Take 4,000 Units by mouth daily.    . phentermine 37.5 MG capsule Take 37.5 mg by mouth daily before breakfast.      No facility-administered medications prior to visit.    PAST MEDICAL HISTORY: Past Medical History:  Diagnosis Date  . Arthritis    per pt L foot on 01/06/20  . 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 Obesity   . OSA (obstructive sleep apnea) 2011   Severe- AHI 66   . OSA on CPAP 03/22/2014    PAST SURGICAL HISTORY: Past Surgical History:  Procedure Laterality Date  . ACHILLES TENDON LENGTHENING Left 01/10/2020   Procedure: ACHILLES TENDON LENGTHENING;  Surgeon: Terance Hart, MD;  Location: [facility] OR;  Service: Orthopedics;  Laterality: Left;  . FOOT ARTHRODESIS Left 01/10/2020   Procedure: LEFT FOOT TRIPLE ARTHRODESIS, TENDO ACHILLES LENGTHENING, ANKLE ARTHROTOMY AND DEBRIDEMENT RESECTION OF TIBIA AND TALUS;  Surgeon: Terance Hart, MD;  Location: [facility] OR;  Service: Orthopedics;  Laterality: Left;  TOTAL SURGERY REQUEST TIME: 3 HOURS  . severe fall  02/2002   TBI, COMA  . STERIOD INJECTION Right 01/10/2020   Procedure: Steroid Injection;  Surgeon: Terance Hart, MD;  Location: [facility] OR;  Service: Orthopedics;  Laterality: Right;  . TENOLYSIS Left 01/10/2020   Procedure: TENDON SHEATH RELEASE/TENOLYSIS;  Surgeon: Terance Hart, MD;  Location: [facility] OR;  Service: Orthopedics;  Laterality: Left;    FAMILY HISTORY: No sleep apnea history. Both parents living and healthy, siblings - 2 brothers, healthy.   SOCIAL HISTORY: Social History   Socioeconomic History  . Marital status: Married    Spouse name: [name]  . Number of children: 0  . Years of education: Bachelor's  . Highest education level: Not on file  Occupational History  . Not on file  Tobacco Use  . Smoking status: Never Smoker  . Smokeless tobacco: Never Used  Substance and Sexual Activity  . Alcohol use: No  .  Drug use: No  . Sexual activity: Not on file  Other Topics Concern  . Not on file  Social History Narrative   Patient is married [name]) and lives with his wife and children.   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     Patient is also a Naval architect.   Patient is right-handed.   Patient does not drink any caffeine.   Patient has a Bachelor's degree.         Social Determinants of Health   Financial Resource Strain: Not on file  Food Insecurity: Not on file  Transportation Needs: Not on file  Physical Activity: Not on file  Stress: Not on file  Social Connections: Not on file  Intimate Partner Violence: Not on file      PHYSICAL EXAM  Vitals:   01/14/21 1440  BP: 128/84  Pulse: 83  Weight: 297 lb (134.7 kg)  Height: 5\' 10"  (1.778 m)   Body mass index is 42.62 kg/m.  Generalized: Well developed, in no acute distress  Neck: Circumference 18 inches, Mallampati 3+  Neurological examination  Mentation: Alert oriented to time, place, history taking. Follows all commands speech and language fluent Cranial nerve: no impairment of smell or taste.  Fully vaccinated.  Pupils were equal round reactive to light. Extraocular movements were full, visual field were full on confrontational test. Facial sensation and strength were normal. Uvula and  tongue midline.  Head turning to the right is weaker and leftshoulder shrug is weak- unable to lift the left shoulder.  Motor: the patient has spasticity and contractures in left hand and wrist. He is right hand dominant.   Left hand interdigital muscle atrophy.  Slightly weaker in the left upper and lower extremity.  Right foot drop remains evident . Sensory: Sensory testing is intact to soft touch on all 4 extremities.  The paralyzed side has still the same feeling.- No evidence of extinction.  Coordination:  Only right good finger-nose-finger is intact  , and only right heel-to-shin- he can not perform this maneuver on the left  Gait and station: Patient has a steppage type gait on the right due to foot drop. Tandem gait not attempted. Reflexes: Upper deep tendon reflexes are slightly more brisk on the left - spasticity, , the lower DTR are not  elicited.   DIAGNOSTIC DATA (LABS, IMAGING, TESTING) - I reviewed patient records, labs, notes, testing and imaging myself where available.  Epworth 12/24 ,  FSS 27/ 63 points. Depression, work related. .    ASSESSMENT AND PLAN   40 y.o. year old male here with:  1. Obstructive sleep apnea on CPAP, consuming neither caffeine, tobacco products, nor alcohol. Highly compliant user 100%.   2. The patient has a brain injury related left hemiparesis with contractures in hand at wrist, he does not have cerebral palsy.   3. Obesity- has been stable.    4. 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  Now full time employee at a [employer].    [employer], MD    01/14/2021, 2:51 PM [facility] [address], [address] [place], [place] [phone]

## 2021-01-14 NOTE — Patient Instructions (Signed)

## 2021-02-18 IMAGING — CT CT OF THE LEFT ANKLE WITHOUT CONTRAST
3 series · 15 of 33 positions shown, 18 images · non-contrast
Comparison: Plain films of the left ankle 07/05/2019.

CLINICAL DATA: Severe left ankle pain for 1 month. No recent
injury.

EXAM:
CT OF THE LEFT ANKLE WITHOUT CONTRAST
TECHNIQUE: Multidetector CT imaging of the left ankle was performed according
to the standard protocol. Multiplanar CT image reconstructions were
also generated.

[Series 4: soft tissue lower extremity · axial · 0.32mm/px · z∈[-203,-65]mm · 7 of 83 slices shown, 9 images]
[im 7/83  soft-tissue]
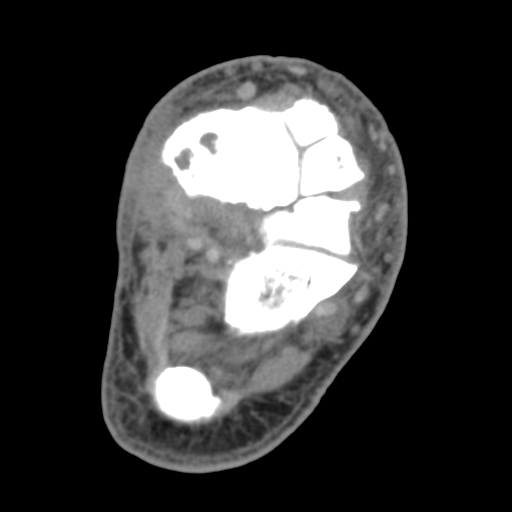
[im 7/83  bone]
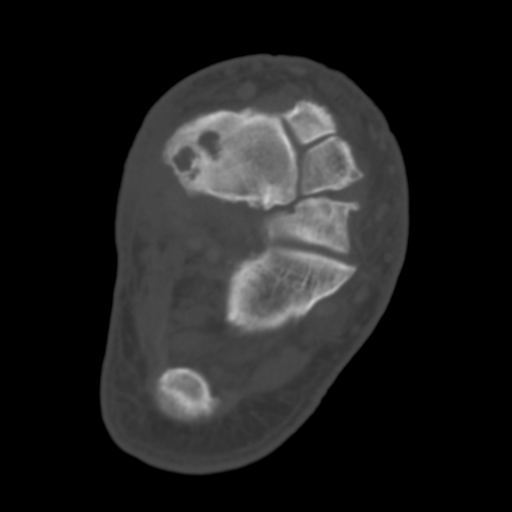
[im 19/83  bone]
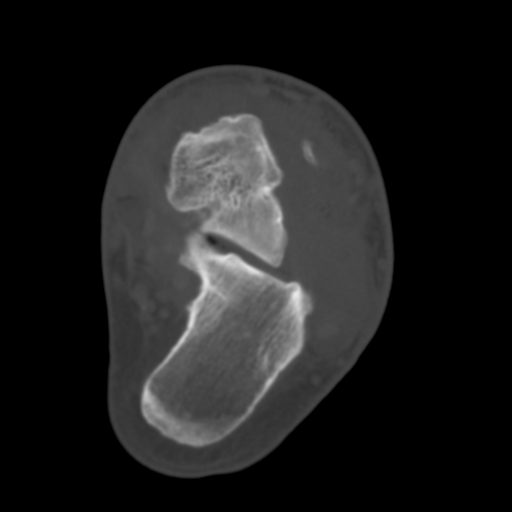
[im 32/83  bone]
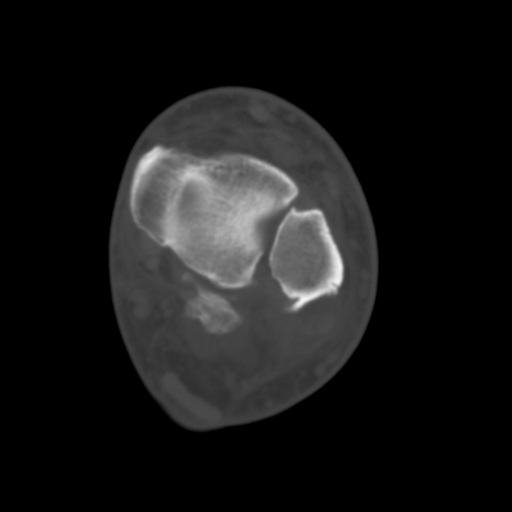
[im 45/83  bone]
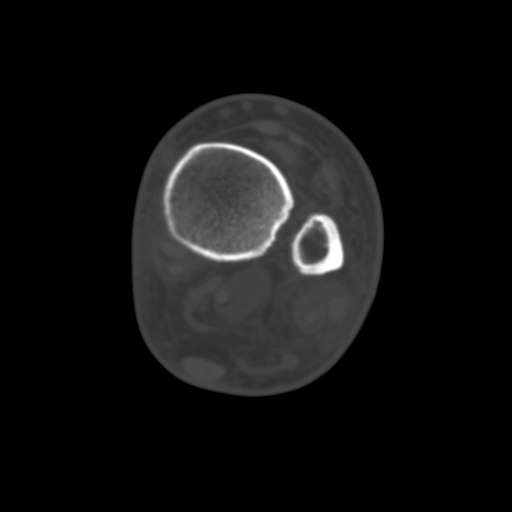
[im 51/83  soft-tissue]
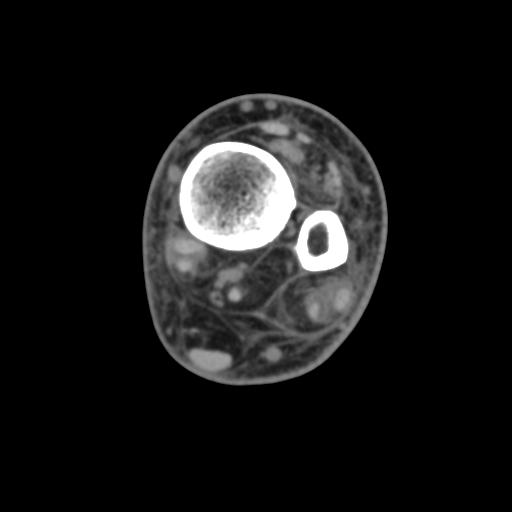
[im 51/83  bone]
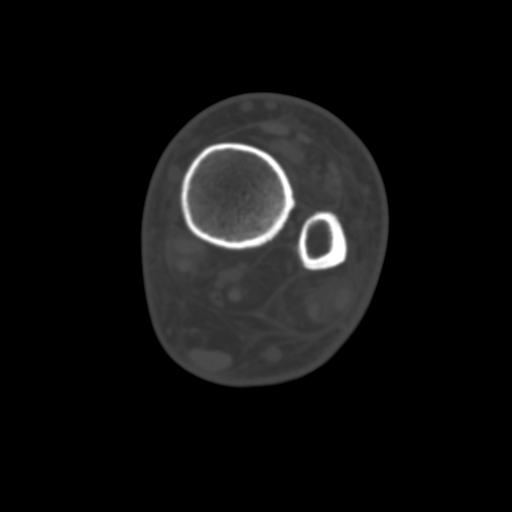
[im 64/83  bone]
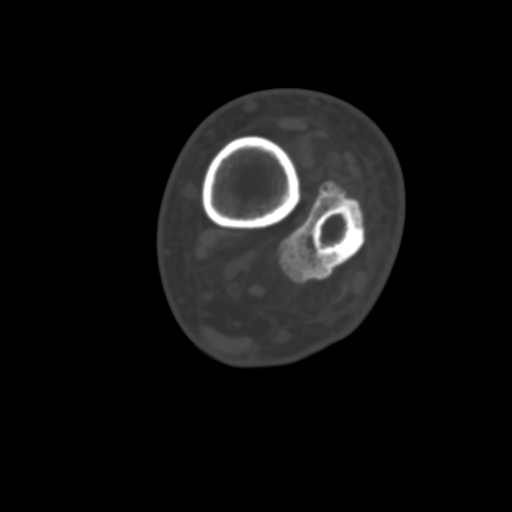
[im 76/83  bone]
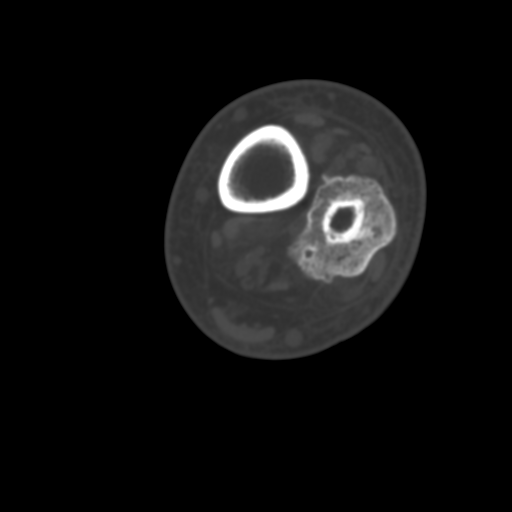

[Series 9: cor soft tissue · coronal · 0.32mm/px · 3 of 85 slices shown]
[im 17/85  bone]
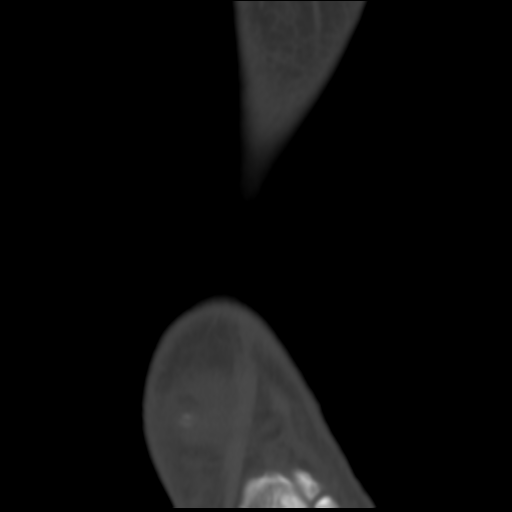
[im 34/85  bone]
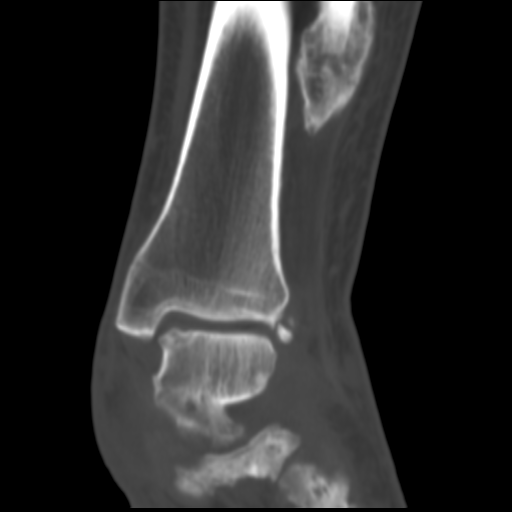
[im 51/85  bone]
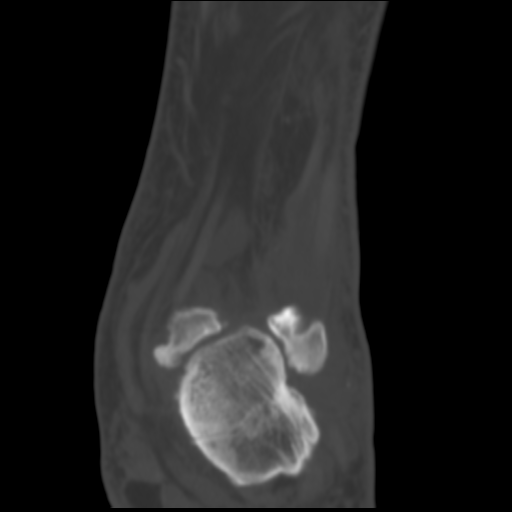

[Series 10: sagsoft tissue · sagittal · 0.32mm/px · 5 of 53 slices shown, 6 images]
[im 18/53  bone]
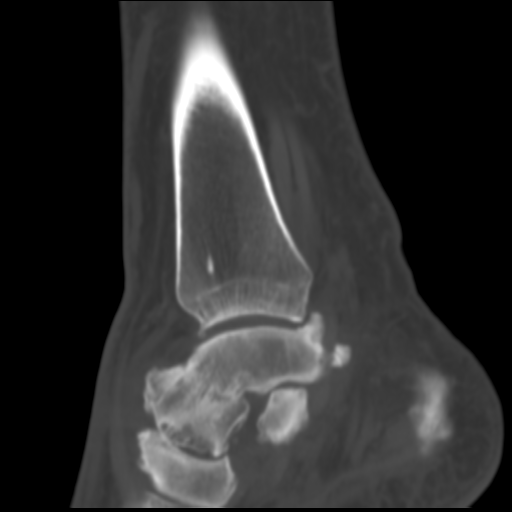
[im 22/53  bone]
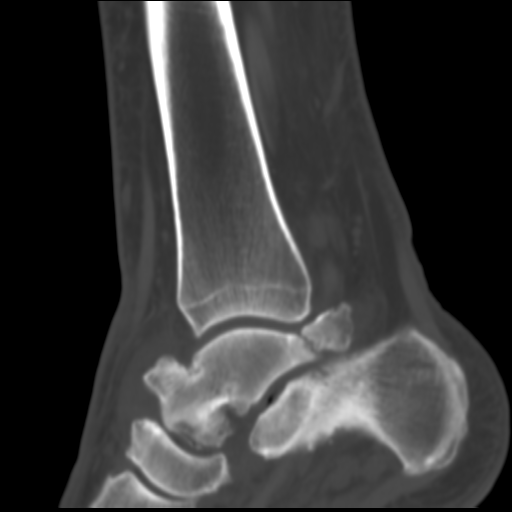
[im 27/53  soft-tissue]
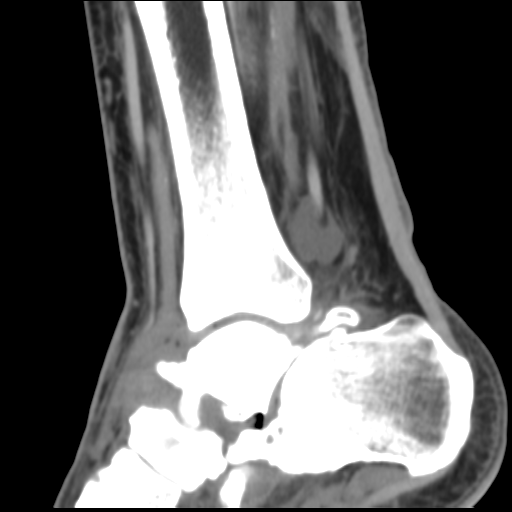
[im 27/53  bone]
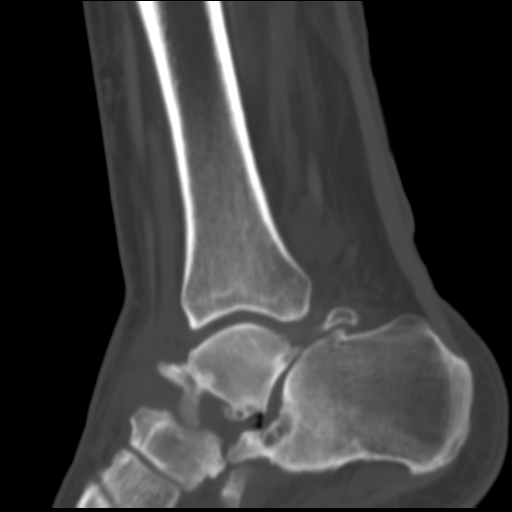
[im 31/53  bone]
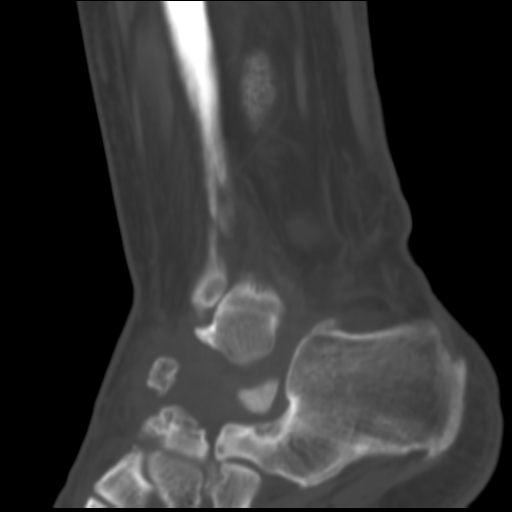
[im 35/53  bone]
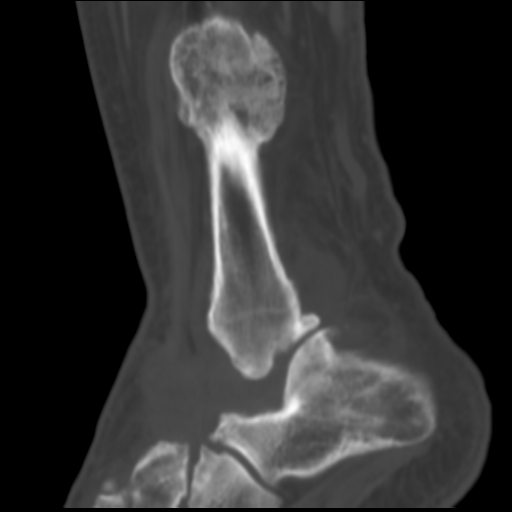

[15 of 33 positions shown; findings below may reference images not displayed]

FINDINGS: Bones/Joint/Cartilage

There is partial visualization of a solidly healed distal fibular
fracture. The patient has an osteochondral lesion of the medial
talar dome measuring 0.6 cm transverse by 1.3 cm AP. There is mild
concavity of the subchondral bone plate at the site of the lesion.

Moderately severe to talonavicular osteoarthritis is present with
joint space narrowing and subchondral cyst formation. There is
flattening of the subchondral bone plate of the head of the talus
measuring 1.4 cm craniocaudal by 1.6 cm transverse. Lucency
underlying the subchondral bone plate is worrisome for fragment
instability.

Moderate osteoarthritis about the subtalar joint is also present.

Subcortical cystic change is seen in the lateral talar process and
adjacent calcaneus. There is some cyst formation in the tip of the
lateral malleolus. The lateral calcaneus abuts the distal fibula.
The angle between the medial cortex of the talus and long axis of
the tibia is 48 degrees.

The patient has a large os trigonum. Cyst formation and sclerosis
about the synchondrosis are identified.

Ligaments

Suboptimally assessed by CT.

Muscles and Tendons

Appear intact.

Soft tissues

Normal fat attenuation within the sinus tarsi is obliterated which
extends lateral to the sinus tarsi. Soft tissue thickening is also
present about the talonavicular joint.
IMPRESSION: Findings most consistent with lateral hindfoot impingement.

Advanced for age subtalar and talonavicular osteoarthritis.

Flattening of the head of the talus may be due to remote injury.
Lucency subjacent to the subchondral bone plate of the head of the
talus is worrisome for fragment instability. Synovitis about the
talonavicular joint is noted.

Findings compatible with os trigonum syndrome.

Findings compatible with sinus tarsi syndrome.

Osteochondral lesion medial talar dome.

## 2022-01-20 ENCOUNTER — Encounter: Payer: Self-pay | Admitting: Family Medicine

## 2022-01-20 ENCOUNTER — Other Ambulatory Visit: Payer: Self-pay

## 2022-01-20 ENCOUNTER — Ambulatory Visit: Payer: BC Managed Care – PPO | Admitting: Family Medicine

## 2022-01-20 VITALS — BP 120/77 | HR 80 | Ht 70.0 in | Wt 295.5 lb

## 2022-01-20 DIAGNOSIS — Z9989 Dependence on other enabling machines and devices: Secondary | ICD-10-CM | POA: Diagnosis not present

## 2022-01-20 DIAGNOSIS — G4733 Obstructive sleep apnea (adult) (pediatric): Secondary | ICD-10-CM | POA: Diagnosis not present

## 2022-01-20 NOTE — Progress Notes (Signed)
PATIENT: Steve Gutierrez DOB: 01-Nov-1981  REASON FOR VISIT: follow up HISTORY FROM: patient  Chief Complaint  Patient presents with   Obstructive Sleep Apnea    Rm 10, alone. Here for yearly CPAP f/u. Pt reports doing well. No issues or concerns.      HISTORY OF PRESENT ILLNESS:  01/20/22 ALL:  Steve Gutierrez is a 41 y.o. male here today for follow up for OSA on CPAP.       HISTORY: (copied from Dr Dohmeier's previous note)  [name] is a 41 year old established CPAP user in our practice who is here for his yearly revisit.  He is using an air sense 10 machine AutoSet with a minimum pressure of 8 maximum pressure of 16 and an EPR level of 3 cmH2O, his residual AHI is 1.4 which is an excellent resolution.  He does have moderate air leakage, his compliance has been 100% by days and time with an average of 8 hours 6 minutes.  This is an air sense 10 machine that has replaced his previously used S9 machine.  It is relatively new.  Based on the air leak data I think that he has a very good mask fit.  He endorsed the Epworth Sleepiness Scale at 12 points out of 24 and the fatigue severity at 28, he did endorse 7 points on the  depression score and he relates that this is due to work stressors. 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  He is working on his master's degree. MBA.    Mr.  Steve Gutierrez  is an established patient of our sleep clinic since 2012. He has left sided  Hemiparesis- he was born with cerebral palsy , but this only very mildly impaired him, he suffered a severely disabling head injury and fracture to his arm and shoulder later at age 77 and this has caused him to have significant plegia in the left arm- he is right-hand dominant.   The patient was diagnosed in a split study in 2011 with obstructive sleep apnea.  Dr. Veronia Beets was the referring physician.    REVIEW OF SYSTEMS: Out of a complete 14 system  review of symptoms, the patient complains only of the following symptoms, and all other reviewed systems are negative.  ESS:  ALLERGIES: No Known Allergies  HOME MEDICATIONS: Outpatient Medications Prior to Visit  Medication Sig Dispense Refill   Ascorbic Acid (VITAMIN C WITH ROSE HIPS) 500 MG tablet Take 500 mg by mouth daily.     Cholecalciferol (VITAMIN D3) 50 MCG (2000 UT) TABS Take 4,000 Units by mouth daily.     phentermine 37.5 MG capsule Take 37.5 mg by mouth daily before breakfast.      No facility-administered medications prior to visit.    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   Cerebral palsy ([place])    congenital with mild left sided weakness   Obesity    OSA (obstructive sleep apnea) 2011   Severe- AHI 66    OSA on CPAP 03/22/2014    PAST SURGICAL HISTORY: Past Surgical History:  Procedure Laterality Date   ACHILLES TENDON LENGTHENING Left 01/10/2020   Procedure: ACHILLES TENDON LENGTHENING;  Surgeon: Erle Crocker,  MD;  Location: [facility] OR;  Service: Orthopedics;  Laterality: Left;   FOOT ARTHRODESIS Left 01/10/2020   Procedure: LEFT FOOT TRIPLE ARTHRODESIS, TENDO ACHILLES LENGTHENING, ANKLE ARTHROTOMY AND DEBRIDEMENT RESECTION OF TIBIA AND TALUS;  Surgeon: Terance Hart, MD;  Location: [facility] OR;  Service: Orthopedics;  Laterality: Left;  TOTAL SURGERY REQUEST TIME: 3 HOURS   severe fall  02/2002   TBI, COMA   STERIOD INJECTION Right 01/10/2020   Procedure: Steroid Injection;  Surgeon: Terance Hart, MD;  Location: [facility] OR;  Service: Orthopedics;  Laterality: Right;   TENOLYSIS Left 01/10/2020   Procedure: TENDON SHEATH RELEASE/TENOLYSIS;  Surgeon: Terance Hart, MD;  Location: [facility] OR;  Service: Orthopedics;  Laterality: Left;    FAMILY HISTORY: History  reviewed. No pertinent family history.  SOCIAL HISTORY: Social History   Socioeconomic History   Marital status: Married    Spouse name: [name]   Number of children: 0   Years of education: Bachelor's   Highest education level: Not on file  Occupational History   Not on file  Tobacco Use   Smoking status: Never   Smokeless tobacco: Never  Substance and Sexual Activity   Alcohol use: No   Drug use: No   Sexual activity: Not on file  Other Topics Concern   Not on file  Social History Narrative   Patient is married [name]) and lives with his wife and children.   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    Patient is also a Naval architect.   Patient is right-handed.   Patient does not drink any caffeine.   Patient has a Bachelor's degree.         Social Determinants of Health   Financial Resource Strain: Not on file  Food Insecurity: Not on file  Transportation Needs: Not on file  Physical Activity: Not on file  Stress: Not on file  Social Connections: Not on file  Intimate Partner Violence: Not on file     PHYSICAL EXAM  Vitals:   01/20/22 1410  BP: 120/77  Pulse: 80  Weight: 295 lb 8 oz (134 kg)  Height: 5\' 10"  (1.778 m)   Body mass index is 42.4 kg/m.  Generalized: Well developed, in no acute distress  Cardiology: normal rate and rhythm, no murmur noted Respiratory: clear to auscultation bilaterally  Neurological examination  Mentation: Alert oriented to time, place, history taking. Follows all commands speech and language fluent Cranial nerve II-XII: Pupils were equal round reactive to light. Extraocular movements were full, visual field were full  Motor: The motor testing reveals 5 over 5 strength of all 4 extremities. Left hand contracture.  Gait and station: Gait is wide but stable.    DIAGNOSTIC DATA (LABS, IMAGING, TESTING) - I reviewed patient records, labs, notes, testing and imaging myself where available.  No flowsheet data found.   Lab  Results  Component Value Date   WBC 8.7 01/06/2020   HGB 15.2 01/06/2020   HCT 47.0 01/06/2020   MCV 91.4 01/06/2020   PLT 295 01/06/2020      Component Value Date/Time   NA 139 01/06/2020 0831   K 4.2 01/06/2020 0831   CL 103 01/06/2020 0831   CO2 26 01/06/2020 0831   GLUCOSE 93 01/06/2020 0831   BUN 19 01/06/2020 0831   CREATININE 0.80 01/06/2020 0831   CALCIUM 9.4 01/06/2020 0831   GFRNONAA >60 01/06/2020 0831   GFRAA >60 01/06/2020 0831   No results found for: CHOL, HDL, LDLCALC, LDLDIRECT,  TRIG, CHOLHDL No results found for: HGBA1C No results found for: VITAMINB12 No results found for: TSH   ASSESSMENT AND PLAN 41 y.o. year old male  has a past medical history of Arthritis, Brain injury (2003), Cerebral palsy ([place]), Obesity, OSA (obstructive sleep apnea) (2011), and OSA on CPAP (03/22/2014). here with     ICD-10-CM   1. OSA on CPAP  G47.33 For home use only DME continuous positive airway pressure (CPAP)   Z99.89        Jaffet Silsby Landgrebe is doing well on CPAP therapy. Compliance report reveals excellent compliance. He was encouraged to continue using CPAP nightly and for greater than 4 hours each night. We will update supply orders as indicated. Risks of untreated sleep apnea review and education materials provided. Healthy lifestyle habits encouraged. He will follow up in 1 year, sooner if needed. He verbalizes understanding and agreement with this plan.    Orders Placed This Encounter  Procedures   For home use only DME continuous positive airway pressure (CPAP)    Supplies    Order Specific Question:   Length of Need    Answer:   [employer]    Order Specific Question:   Patient has OSA or probable OSA    Answer:   Yes    Order Specific Question:   Is the patient currently using CPAP in the home    Answer:   Yes    Order Specific Question:   Settings    Answer:   Other see comments    Order Specific Question:   CPAP supplies needed    Answer:   Mask, headgear,  cushions, filters, heated tubing and water chamber     No orders of the defined types were placed in this encounter.     Debbora Presto, FNP-C 01/20/2022, 2:38 PM [facility] [address] [place], [place] [address] [phone]

## 2022-01-20 NOTE — Progress Notes (Signed)
Sent CM to [facility].

## 2022-01-20 NOTE — Patient Instructions (Signed)

## 2023-01-15 ENCOUNTER — Encounter: Payer: Self-pay | Admitting: Family Medicine

## 2023-01-20 ENCOUNTER — Ambulatory Visit: Payer: BC Managed Care – PPO | Admitting: Family Medicine

## 2023-01-20 ENCOUNTER — Encounter: Payer: Self-pay | Admitting: Family Medicine

## 2023-01-20 VITALS — BP 138/94 | HR 80 | Ht 70.0 in | Wt 286.0 lb

## 2023-01-20 DIAGNOSIS — G4733 Obstructive sleep apnea (adult) (pediatric): Secondary | ICD-10-CM | POA: Diagnosis not present

## 2023-01-20 NOTE — Progress Notes (Signed)
PATIENT: Steve Gutierrez DOB: August 26, 1981  REASON FOR VISIT: follow up HISTORY FROM: patient  Chief Complaint  Patient presents with   Room 2    Pt is here Alone. Pt states that things have been going good with his CPAP Machine. Pt states no dry mouth or soar throat. Pt states that he isn't sleeping throughout the night.      HISTORY OF PRESENT ILLNESS:  01/20/23 ALL:  Steve Gutierrez returns for follow up for OSA on CPAP. He continues to do well. He is using therapy nightly for about 8-9 hours on average. He reports sleeping fairly well but has started waking up around 2am. He is usually able to go back to sleep. He is not certain what wakes him up. He usually wakes feeling refreshed.      01/20/2022 ALL: Steve Gutierrez is a 42 y.o. male here today for follow up for OSA on CPAP.       HISTORY: (copied from Dr Dohmeier's previous note)  Steve Gutierrez is a 42 year old established CPAP user in our practice who is here for his yearly revisit.  He is using an air sense 10 machine AutoSet with a minimum pressure of 8 maximum pressure of 16 and an EPR level of 3 cmH2O, his residual AHI is 1.4 which is an excellent resolution.  He does have moderate air leakage, his compliance has been 100% by days and time with an average of 8 hours 6 minutes.  This is an air sense 10 machine that has replaced his previously used S9 machine.  It is relatively new.  Based on the air leak data I think that he has a very good mask fit.  He endorsed the Epworth Sleepiness Scale at 12 points out of 24 and the fatigue severity at 28, he did endorse 7 points on the  depression score and he relates that this is due to work stressors. 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  He is working on his master's degree. MBA.    [name]  is an established patient of our sleep clinic since 2012. He has left sided  Hemiparesis- he was born with cerebral palsy , but  this only very mildly impaired him, he suffered a severely disabling head injury and fracture to his arm and shoulder later at age 56 and this has caused him to have significant plegia in the left arm- he is right-hand dominant.   The patient was diagnosed in a split study in 2011 with obstructive sleep apnea.  Dr. Veronia Beets was the referring physician.    REVIEW OF SYSTEMS: Out of a complete 14 system review of symptoms, the patient complains only of the following symptoms, wakes in early morning and all other reviewed systems are negative.  ESS: 10/24  ALLERGIES: No Known Allergies  HOME MEDICATIONS: Outpatient Medications Prior to Visit  Medication Sig Dispense Refill   Ascorbic Acid (VITAMIN C WITH ROSE HIPS) 500 MG tablet Take 500 mg by mouth daily.     Cholecalciferol (VITAMIN D3) 50 MCG (2000 UT) TABS Take 4,000 Units by mouth daily.     Semaglutide-Weight Management (WEGOVY) 2.4 MG/0.75ML SOAJ Inject 2.4 mg into the skin every 7 (seven) days.     phentermine 37.5 MG capsule Take 37.5 mg by mouth daily before breakfast.      No facility-administered medications prior to visit.    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   Cerebral palsy ([place])    congenital with mild left sided weakness   Obesity    OSA (obstructive sleep apnea) 2011   Severe- AHI 66    OSA on CPAP 03/22/2014    PAST SURGICAL HISTORY: Past Surgical History:  Procedure Laterality Date   ACHILLES TENDON LENGTHENING Left 01/10/2020   Procedure: ACHILLES TENDON LENGTHENING;  Surgeon: Erle Crocker, MD;  Location: [place];  Service: Orthopedics;  Laterality: Left;   FOOT ARTHRODESIS Left 01/10/2020   Procedure: LEFT FOOT TRIPLE ARTHRODESIS, TENDO ACHILLES LENGTHENING, ANKLE ARTHROTOMY AND DEBRIDEMENT RESECTION  OF TIBIA AND TALUS;  Surgeon: Erle Crocker, MD;  Location: [place];  Service: Orthopedics;  Laterality: Left;  TOTAL SURGERY REQUEST TIME: 3 HOURS   severe fall  02/2002   TBI, COMA   STERIOD INJECTION Right 01/10/2020   Procedure: Steroid Injection;  Surgeon: Erle Crocker, MD;  Location: [place];  Service: Orthopedics;  Laterality: Right;   TENOLYSIS Left 01/10/2020   Procedure: TENDON SHEATH RELEASE/TENOLYSIS;  Surgeon: Erle Crocker, MD;  Location: [place];  Service: Orthopedics;  Laterality: Left;    FAMILY HISTORY: History reviewed. No pertinent family history.  SOCIAL HISTORY: Social History   Socioeconomic History   Marital status: Married    Spouse name: Steve Gutierrez   Number of children: 0   Years of education: Bachelor's   Highest education level: Not on file  Occupational History   Not on file  Tobacco Use   Smoking status: Never   Smokeless tobacco: Never  Substance and Sexual Activity   Alcohol use: No   Drug use: No   Sexual activity: Not on file  Other Topics Concern   Not on file  Social History Narrative   Patient is married Steve Gutierrez) and lives with his wife and children.   Patient works at a Museum/gallery curator.    Patient is also a Museum/gallery conservator.   Patient is right-handed.   Patient does not drink any caffeine.   Patient has a Bachelor's degree.         Social Determinants of Health   Financial Resource Strain: Not on file  Food Insecurity: Not on file  Transportation Needs: Not on file  Physical Activity: Not on file  Stress: Not on file  Social Connections: Not on file  Intimate Partner Violence: Not on file     PHYSICAL EXAM  Vitals:   01/20/23 1449  BP: (!) 138/94  Pulse: 80  Weight: 286 lb (129.7 kg)  Height: 5\' 10"  (1.778 m)    Body mass index is 41.04 kg/m.  Generalized: Well developed, in no acute distress  Cardiology: normal rate and rhythm, no murmur noted Respiratory: clear to auscultation bilaterally   Neurological examination  Mentation: Alert oriented to time, place, history taking. Follows all commands speech and language fluent Cranial nerve II-XII: Pupils were equal round reactive to light. Extraocular movements were full, visual field were full  Motor: The motor testing reveals 5 over 5 strength of all 4 extremities. Left hand contracture.  Gait and station: Gait is wide but stable.    DIAGNOSTIC DATA (LABS, IMAGING, TESTING) - I reviewed patient records, labs, notes, testing and imaging myself where available.      No data to  display           Lab Results  Component Value Date   WBC 8.7 01/06/2020   HGB 15.2 01/06/2020   HCT 47.0 01/06/2020   MCV 91.4 01/06/2020   PLT 295 01/06/2020      Component Value Date/Time   NA 139 01/06/2020 0831   K 4.2 01/06/2020 0831   CL 103 01/06/2020 0831   CO2 26 01/06/2020 0831   GLUCOSE 93 01/06/2020 0831   BUN 19 01/06/2020 0831   CREATININE 0.80 01/06/2020 0831   CALCIUM 9.4 01/06/2020 0831   GFRNONAA >60 01/06/2020 0831   GFRAA >60 01/06/2020 0831   No results found for: "CHOL", "HDL", "LDLCALC", "LDLDIRECT", "TRIG", "CHOLHDL" No results found for: "HGBA1C" No results found for: "VITAMINB12" No results found for: "TSH"   ASSESSMENT AND PLAN 42 y.o. year old male  has a past medical history of Arthritis, Brain injury ([place]) (2003), Cerebral palsy ([place]), Obesity, OSA (obstructive sleep apnea) (2011), and OSA on CPAP (03/22/2014). here with     ICD-10-CM   1. OSA on CPAP  G47.33 For home use only DME continuous positive airway pressure (CPAP)       Marcelis Wissner Jeremiah is doing well on CPAP therapy. Compliance report reveals excellent compliance. He was encouraged to continue using CPAP nightly and for greater than 4 hours each night. Consider melatonin XR or valerian root for sleep maintenance. We will update supply orders as indicated. Risks of untreated sleep apnea review and education materials provided. Healthy lifestyle  habits encouraged. He will follow up in 1 year, sooner if needed. He verbalizes understanding and agreement with this plan.    Orders Placed This Encounter  Procedures   For home use only DME continuous positive airway pressure (CPAP)    Supplies    Order Specific Question:   Length of Need    Answer:   Lifetime    Order Specific Question:   Patient has OSA or probable OSA    Answer:   Yes    Order Specific Question:   Is the patient currently using CPAP in the home    Answer:   Yes    Order Specific Question:   Settings    Answer:   Other see comments    Order Specific Question:   CPAP supplies needed    Answer:   Mask, headgear, cushions, filters, heated tubing and water chamber     No orders of the defined types were placed in this encounter.     Debbora Presto, FNP-C 01/20/2023, 3:19 PM [facility] [address], [place], [place] [address] [phone]

## 2023-01-20 NOTE — Patient Instructions (Addendum)
Please continue using your CPAP regularly. While your insurance requires that you use CPAP at least 4 hours each night on 70% of the nights, I recommend, that you not skip any nights and use it throughout the night if you can. Getting used to CPAP and staying with the treatment long term does take time and patience and discipline. Untreated obstructive sleep apnea when it is moderate to severe can have an adverse impact on cardiovascular health and raise her risk for heart disease, arrhythmias, hypertension, congestive heart failure, stroke and diabetes. Untreated obstructive sleep apnea causes sleep disruption, nonrestorative sleep, and sleep deprivation. This can have an impact on your day to day functioning and cause daytime sleepiness and impairment of cognitive function, memory loss, mood disturbance, and problems focussing. Using CPAP regularly can improve these symptoms.  Try extended release melatonin. Valerian root is another option. Consider a journal to see what may be causing you to wake up. Discuss weight management with Dr Melford Aase.   Follow up in 1 year

## 2023-04-15 NOTE — Progress Notes (Signed)
Steve Gutierrez [address] [place] [address] Phone: [phone] Subjective:   Bruce Donath, am serving as a scribe for Dr. Antoine Primas.  I'm seeing this patient by the request  of:  Eartha Inch, MD  CC: leg pain   BJY:[id number]  Steve Gutierrez is a 42 y.o. male coming in with complaint of pain in legs and hips. Patient states that for past 2 months he has had pain in B quads. Pain sometimes radiates into the knees. Has a hard time from sit to stand. Ok to run and workout. Stairs also reproduce pain. Feels like his leg is "locked". Pain occurring every daily. Does have intermittent lower back pain.   Dr. Susa Simmonds did his foot surgery so he went to see Dr. Althea Charon. Had some injections in his backside. Had some relief. Still though with pain. 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   Cerebral palsy ([facility])    congenital with mild left sided weakness   Obesity    OSA (obstructive sleep apnea) 2011   Severe- AHI 64    OSA on CPAP 03/22/2014   Past Surgical History:  Procedure Laterality Date   ACHILLES TENDON LENGTHENING Left 01/10/2020   Procedure: ACHILLES TENDON LENGTHENING;  Surgeon: Terance Hart, MD;  Location: [facility];  Service: Orthopedics;  Laterality: Left;   FOOT ARTHRODESIS Left 01/10/2020   Procedure: LEFT FOOT TRIPLE ARTHRODESIS, TENDO ACHILLES LENGTHENING, ANKLE ARTHROTOMY AND DEBRIDEMENT RESECTION OF TIBIA AND TALUS;  Surgeon: Terance Hart, MD;  Location: [facility] OR;  Service: Orthopedics;  Laterality: Left;  TOTAL SURGERY REQUEST TIME: 3 HOURS   severe fall  02/2002   TBI, COMA   STERIOD INJECTION Right 01/10/2020   Procedure: Steroid Injection;  Surgeon: Terance Hart, MD;  Location: [facility] OR;   Service: Orthopedics;  Laterality: Right;   TENOLYSIS Left 01/10/2020   Procedure: TENDON SHEATH RELEASE/TENOLYSIS;  Surgeon: Terance Hart, MD;  Location: [facility] OR;  Service: Orthopedics;  Laterality: Left;   Social History   Socioeconomic History   Marital status: Married    Spouse name: [name]   Number of children: 0   Years of education: Bachelor's   Highest education level: Not on file  Occupational History   Not on file  Tobacco Use   Smoking status: Never   Smokeless tobacco: Never  Substance and Sexual Activity   Alcohol use: No   Drug use: No   Sexual activity: Not on file  Other Topics Concern   Not on file  Social History Narrative   Patient is married [name]) and lives with his wife and children.   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    Patient is also a Naval architect.   Patient is right-handed.   Patient does not drink any caffeine.   Patient has a Bachelor's degree.         Social Determinants of Health   Financial Resource Strain: Not on file  Food Insecurity: Not on file  Transportation Needs: Not on file  Physical Activity: Not on file  Stress: Not on file  Social Connections: Not on file   No Known Allergies No family history on file.  Current Outpatient Medications (Other):    Ascorbic Acid (VITAMIN C WITH ROSE HIPS) 500 MG tablet, Take 500 mg by mouth daily.   Cholecalciferol (VITAMIN D3) 50 MCG (2000 UT) TABS, Take 4,000 Units by mouth daily.   gabapentin (NEURONTIN) 100 MG capsule, Take 2 capsules (200 mg total) by mouth at bedtime.   Semaglutide-Weight Management (WEGOVY) 2.4 MG/0.75ML SOAJ, Inject 2.4 mg into the skin every 7 (seven) days.   Reviewed prior external information including notes and imaging from  primary care provider As well as notes that were available from care everywhere and other healthcare systems.  Past medical history, social, surgical and family history all reviewed in electronic medical record.   No pertanent information unless stated regarding to the chief complaint.   Review of Systems:  No headache, visual changes, nausea, vomiting, diarrhea, constipation, dizziness, abdominal pain, skin rash, fevers, chills, night sweats, weight loss, swollen lymph nodes,  joint swelling, chest pain, shortness of breath, mood changes. POSITIVE muscle aches,  body aches   Objective  Blood pressure 128/88, pulse 68, height 5\' 10"  (1.778 m), weight 284 lb (128.8 kg), SpO2 98 %.   General: No apparent distress alert and oriented x3 mood and affect normal, dressed appropriately.  HEENT: Pupils equal, extraocular movements intact  Respiratory: Patient's speak in full sentences and does not appear short of breath  Antalgic gait noted. Does have left sided weakness of UE and LE, contracture of left upper mild forearm atrophy  Leftsided foot drop noted  Loss of lordosis of the back Tightness in the quads and tight SLT but no radiation       Impression and Recommendations:

## 2023-04-16 ENCOUNTER — Ambulatory Visit: Payer: BC Managed Care – PPO | Admitting: Family Medicine

## 2023-04-16 VITALS — BP 128/88 | HR 68 | Ht 70.0 in | Wt 284.0 lb

## 2023-04-16 DIAGNOSIS — M79604 Pain in right leg: Secondary | ICD-10-CM | POA: Diagnosis not present

## 2023-04-16 DIAGNOSIS — M79605 Pain in left leg: Secondary | ICD-10-CM | POA: Diagnosis not present

## 2023-04-16 DIAGNOSIS — M2550 Pain in unspecified joint: Secondary | ICD-10-CM | POA: Diagnosis not present

## 2023-04-16 LAB — URIC ACID: Uric Acid, Serum: 7 mg/dL (ref 4.0–7.8)

## 2023-04-16 LAB — CBC WITH DIFFERENTIAL/PLATELET
Basophils Absolute: 0.1 10*3/uL (ref 0.0–0.1)
Basophils Relative: 0.7 % (ref 0.0–3.0)
Eosinophils Absolute: 0.1 10*3/uL (ref 0.0–0.7)
Eosinophils Relative: 0.7 % (ref 0.0–5.0)
HCT: 44.7 % (ref 39.0–52.0)
Hemoglobin: 15.4 g/dL (ref 13.0–17.0)
Lymphocytes Relative: 33.8 % (ref 12.0–46.0)
Lymphs Abs: 2.9 10*3/uL (ref 0.7–4.0)
MCHC: 34.3 g/dL (ref 30.0–36.0)
MCV: 89.2 fl (ref 78.0–100.0)
Monocytes Absolute: 0.7 10*3/uL (ref 0.1–1.0)
Monocytes Relative: 8.7 % (ref 3.0–12.0)
Neutro Abs: 4.7 10*3/uL (ref 1.4–7.7)
Neutrophils Relative %: 56.1 % (ref 43.0–77.0)
Platelets: 287 10*3/uL (ref 150.0–400.0)
RBC: 5.02 Mil/uL (ref 4.22–5.81)
RDW: 13.8 % (ref 11.5–15.5)
WBC: 8.4 10*3/uL (ref 4.0–10.5)

## 2023-04-16 LAB — COMPREHENSIVE METABOLIC PANEL
ALT: 22 U/L (ref 0–53)
AST: 19 U/L (ref 0–37)
Albumin: 4.2 g/dL (ref 3.5–5.2)
Alkaline Phosphatase: 51 U/L (ref 39–117)
BUN: 19 mg/dL (ref 6–23)
CO2: 27 mEq/L (ref 19–32)
Calcium: 9 mg/dL (ref 8.4–10.5)
Chloride: 102 mEq/L (ref 96–112)
Creatinine, Ser: 0.75 mg/dL (ref 0.40–1.50)
GFR: 110.34 mL/min (ref >60.00)
Glucose, Bld: 83 mg/dL (ref 70–99)
Potassium: 4 mEq/L (ref 3.5–5.1)
Sodium: 138 mEq/L (ref 135–145)
Total Bilirubin: 0.3 mg/dL (ref 0.2–1.2)
Total Protein: 7 g/dL (ref 6.0–8.3)

## 2023-04-16 LAB — VITAMIN B12: Vitamin B-12: 414 pg/mL (ref 211–911)

## 2023-04-16 LAB — FERRITIN: Ferritin: 109.3 ng/mL (ref 22.0–322.0)

## 2023-04-16 LAB — SEDIMENTATION RATE: Sed Rate: 17 mm/hr — ABNORMAL HIGH (ref 0–15)

## 2023-04-16 LAB — IBC PANEL
Iron: 84 ug/dL (ref 42–165)
Saturation Ratios: 22.9 % (ref 20.0–50.0)
TIBC: 366.8 ug/dL (ref 250.0–450.0)
Transferrin: 262 mg/dL (ref 212.0–360.0)

## 2023-04-16 LAB — TESTOSTERONE: Testosterone: 331.48 ng/dL (ref 300.00–890.00)

## 2023-04-16 LAB — VITAMIN D 25 HYDROXY (VIT D DEFICIENCY, FRACTURES): VITD: 17.92 ng/mL — ABNORMAL LOW (ref 30.00–100.00)

## 2023-04-16 LAB — TSH: TSH: 1.03 u[IU]/mL (ref 0.35–5.50)

## 2023-04-16 MED ORDER — GABAPENTIN 100 MG PO CAPS: 200.0000 mg | ORAL_CAPSULE | Freq: Every day | ORAL | 0 refills | Status: DC

## 2023-04-16 NOTE — Patient Instructions (Signed)
Gabapentin  Quad exercises Labs today See me again in 6 weeks

## 2023-04-18 LAB — PTH, INTACT AND CALCIUM
Calcium: 9.1 mg/dL (ref 8.6–10.3)
PTH: 35 pg/mL (ref 16–77)

## 2023-04-19 DIAGNOSIS — M79604 Pain in right leg: Secondary | ICD-10-CM | POA: Insufficient documentation

## 2023-04-19 NOTE — Assessment & Plan Note (Signed)
Very difficult, multifactorial.  Discussed with patient neurologic, vs msk, vs vascular.  Concern is mostly more radiation of the back.  Hx of CP and hx of RA. Will get labs to see what could be contributing.  HEP given  Discussed gabapentin and OTC meds.  RTC in 6-8 weeks.

## 2023-05-26 NOTE — Progress Notes (Unsigned)
[facility] [address] [place] [address] Phone: [phone] Subjective:   INadine Counts, am serving as a scribe for Dr. Antoine Primas.  I'm seeing this patient by the request  of:  Eartha Inch, MD  CC: polyarthralgia f/u   BJY:NWGNFAOZHY  04/16/2023 Very difficult, multifactorial.  Discussed with patient neurologic, vs msk, vs vascular.  Concern is mostly more radiation of the back.  Hx of CP and hx of RA. Will get labs to see what could be contributing.  HEP given  Discussed gabapentin and OTC meds.  RTC in 6-8 weeks.      Updated 05/28/2023 Ahmeir Stepka Staver is a 42 y.o. male coming in with complaint of polyarthralgia. No change. Still having flares. Stretching sometimes helps. No new concerns. Still take medication?   Since we have seen patient patient was patient seen by pain medicine and was given an L3-L4 nerve root block this was done bilaterally.  Laboratory workup did show vitamin D being at 17 but otherwise laboratory workup was unremarkable  Past Medical History:  Diagnosis Date   Arthritis    per pt L foot on 01/06/20   Brain injury 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   Cerebral palsy ([facility])    congenital with mild left sided weakness   Obesity    OSA (obstructive sleep apnea) 2011   Severe- AHI 64    OSA on CPAP 03/22/2014   Past Surgical History:  Procedure Laterality Date   ACHILLES TENDON LENGTHENING Left 01/10/2020   Procedure: ACHILLES TENDON LENGTHENING;  Surgeon: Terance Hart, MD;  Location: [facility] OR;  Service: Orthopedics;  Laterality: Left;   FOOT ARTHRODESIS Left 01/10/2020   Procedure: LEFT FOOT TRIPLE ARTHRODESIS, TENDO ACHILLES LENGTHENING, ANKLE ARTHROTOMY AND DEBRIDEMENT RESECTION OF TIBIA AND TALUS;  Surgeon: Terance Hart, MD;  Location: [facility] OR;  Service: Orthopedics;  Laterality: Left;  TOTAL SURGERY REQUEST  TIME: 3 HOURS   severe fall  02/2002   TBI, COMA   STERIOD INJECTION Right 01/10/2020   Procedure: Steroid Injection;  Surgeon: Terance Hart, MD;  Location: [facility] OR;  Service: Orthopedics;  Laterality: Right;   TENOLYSIS Left 01/10/2020   Procedure: TENDON SHEATH RELEASE/TENOLYSIS;  Surgeon: Terance Hart, MD;  Location: [facility] OR;  Service: Orthopedics;  Laterality: Left;   Social History   Socioeconomic History   Marital status: Married    Spouse name: [name]   Number of children: 0   Years of education: Bachelor's   Highest education level: Not on file  Occupational History   Not on file  Tobacco Use   Smoking status: Never   Smokeless tobacco: Never  Substance and Sexual Activity   Alcohol use: No   Drug use: No   Sexual activity: Not on file  Other Topics Concern   Not on file  Social History Narrative   Patient is married [name]) and lives with his wife and children.   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    Patient is also a Naval architect.   Patient is right-handed.   Patient does not drink any caffeine.   Patient has a Bachelor's degree.         Social Determinants of Health   Financial Resource Strain: Not on file  Food Insecurity: Not on file  Transportation Needs: Not on file  Physical Activity: Not on file  Stress: Not on  file  Social Connections: Not on file   No Known Allergies No family history on file.       Current Outpatient Medications (Other):    Ascorbic Acid (VITAMIN C WITH ROSE HIPS) 500 MG tablet, Take 500 mg by mouth daily.   Cholecalciferol (VITAMIN D3) 50 MCG (2000 UT) TABS, Take 4,000 Units by mouth daily.   gabapentin (NEURONTIN) 100 MG capsule, Take 2 capsules (200 mg total) by mouth at bedtime.   Semaglutide-Weight Management (WEGOVY) 2.4 MG/0.75ML SOAJ, Inject 2.4 mg into the skin every 7 (seven) days.   Reviewed prior external information including notes and imaging from  primary care provider As well as notes  that were available from care everywhere and other healthcare systems.  Past medical history, social, surgical and family history all reviewed in electronic medical record.  No pertanent information unless stated regarding to the chief complaint.   Review of Systems:  No headache, visual changes, nausea, vomiting, diarrhea, constipation, dizziness, abdominal pain, skin rash, fevers, chills, night sweats, weight loss, swollen lymph nodes, body aches, joint swelling, chest pain, shortness of breath, mood changes. POSITIVE muscle aches  Objective  Blood pressure 106/82, pulse 78, height 5\' 10"  (1.778 m), weight 292 lb (132.5 kg), SpO2 97 %.   General: No apparent distress alert and oriented x3 mood and affect normal, dressed appropriately.  HEENT: Pupils equal, extraocular movements intact  Respiratory: Patient's speak in full sentences and does not appear short of breath  Cardiovascular: No lower extremity edema, non tender, no erythema  Antalgic gait noted.  Lacks last 20 degrees of extension of the back.  Patient does have some weakness noted in the quadriceps bilaterally.  Neurovascularly intact distally.  Contracture of the left upper extremity noted.  Pain seems to be out of proportion at this time.  4 out of 5 strength of the lower extremities but symmetric    Impression and Recommendations:    The above documentation has been reviewed and is accurate and complete Judi Saa, DO

## 2023-05-28 ENCOUNTER — Ambulatory Visit: Payer: BC Managed Care – PPO | Admitting: Family Medicine

## 2023-05-28 VITALS — BP 106/82 | HR 78 | Ht 70.0 in | Wt 292.0 lb

## 2023-05-28 DIAGNOSIS — M79604 Pain in right leg: Secondary | ICD-10-CM | POA: Diagnosis not present

## 2023-05-28 DIAGNOSIS — M79605 Pain in left leg: Secondary | ICD-10-CM | POA: Diagnosis not present

## 2023-05-28 DIAGNOSIS — E559 Vitamin D deficiency, unspecified: Secondary | ICD-10-CM | POA: Diagnosis not present

## 2023-05-28 DIAGNOSIS — M545 Low back pain, unspecified: Secondary | ICD-10-CM

## 2023-05-28 MED ORDER — VITAMIN D (ERGOCALCIFEROL) 1.25 MG (50000 UNIT) PO CAPS: 50000.0000 [IU] | ORAL_CAPSULE | ORAL | 0 refills | Status: DC

## 2023-05-28 NOTE — Assessment & Plan Note (Signed)
Still seems to be multifactorial but unfortunately with the radicular symptoms I am little more concerned now at this moment for more of a neurologic claudication that could be potentially concerning.  Patient's x-rays do show some degenerative disc disease at multiple levels.  At this point with patient failing all conservative therapy including formal physical therapy, gabapentin, over-the-counter medications, home exercises and due to patient's other comorbidities including cerebral palsy and rheumatoid arthritis I do feel that advanced imaging is warranted at this time.  Will get an MRI of the lumbar spine for further evaluation.  Depending on findings see if patient is a candidate for either epidurals, nerve root injections, medial branch block or radiofrequency ablation.

## 2023-05-28 NOTE — Patient Instructions (Addendum)
Once weekly Vit D MRI lumbar [id number] Stop gabapentin We will be in touch

## 2023-05-28 NOTE — Assessment & Plan Note (Signed)
Given vitamin D prescription today

## 2024-01-18 NOTE — Progress Notes (Deleted)
 Marland Kitchen

## 2024-01-19 ENCOUNTER — Ambulatory Visit: Payer: BC Managed Care – PPO | Admitting: Family Medicine

## 2024-04-25 NOTE — Patient Instructions (Signed)

## 2024-04-25 NOTE — Progress Notes (Unsigned)
 PATIENT: Steve Gutierrez DOB: [birth year]  REASON FOR VISIT: follow up HISTORY FROM: patient  No chief complaint on file.    HISTORY OF PRESENT ILLNESS:  04/25/24 ALL:  Steve Gutierrez returns for follow up for OSA on CPAP.     01/20/2023 ALL:  Steve Gutierrez returns for follow up for OSA on CPAP. He continues to do well. He is using therapy nightly for about 8-9 hours on average. He reports sleeping fairly well but has started waking up around 2am. He is usually able to go back to sleep. He is not certain what wakes him up. He usually wakes feeling refreshed.      01/20/2022 ALL: Steve Gutierrez is a 43 y.o. male here today for follow up for OSA on CPAP.       HISTORY: (copied from Dr Dohmeier's previous note)  [name] is a 43 year old established CPAP user in our practice who is here for his yearly revisit.  He is using an air sense 10 machine AutoSet with a minimum pressure of 8 maximum pressure of 16 and an EPR level of 3 cmH2O, his residual AHI is 1.4 which is an excellent resolution.  He does have moderate air leakage, his compliance has been 100% by days and time with an average of 8 hours 6 minutes.  This is an air sense 10 machine that has replaced his previously used S9 machine.  It is relatively new.  Based on the air leak data I think that he has a very good mask fit.  He endorsed the Epworth Sleepiness Scale at 12 points out of 24 and the fatigue severity at 28, he did endorse 7 points on the  depression score and he relates that this is due to work stressors. 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  He is working on his master's degree. MBA.    Mr.  Steve Gutierrez  is an established patient of our sleep clinic since 2012. 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   The patient was diagnosed in a split study in 2011 with obstructive sleep apnea.  Dr. Earlean Glaze was the referring physician.    REVIEW OF SYSTEMS: Out of a complete 14 system review of symptoms, the patient complains only of the following symptoms, wakes in early morning and all other reviewed systems are negative.  ESS: 10/24  ALLERGIES: No Known Allergies  HOME MEDICATIONS: Outpatient Medications Prior to Visit  Medication Sig Dispense Refill   Ascorbic Acid (VITAMIN C WITH ROSE HIPS) 500 MG tablet Take 500 mg by mouth daily.     Cholecalciferol (VITAMIN D3) 50 MCG (2000 UT) TABS Take 4,000 Units by mouth daily.     gabapentin  (NEURONTIN ) 100 MG capsule Take 2 capsules (200 mg total) by mouth at bedtime. 180 capsule 0   Semaglutide-Weight Management (WEGOVY) 2.4 MG/0.75ML SOAJ Inject 2.4 mg into the skin every 7 (seven) days.     Vitamin D , Ergocalciferol , (DRISDOL ) 1.25 MG (50000 UNIT) CAPS capsule Take 1 capsule (50,000 Units total) by mouth every 7 (seven) days. 12 capsule 0   No facility-administered medications prior to visit.    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   Obesity    OSA (obstructive sleep apnea) 2011   Severe- AHI 66    OSA on CPAP 03/22/2014    PAST SURGICAL HISTORY: Past Surgical History:  Procedure Laterality Date   ACHILLES TENDON LENGTHENING Left 01/10/2020   Procedure: ACHILLES TENDON LENGTHENING;  Surgeon: Donnamarie Gables, MD;  Location: [facility] OR;  Service: Orthopedics;  Laterality: Left;   FOOT ARTHRODESIS Left 01/10/2020   Procedure: LEFT FOOT TRIPLE ARTHRODESIS, TENDO ACHILLES  LENGTHENING, ANKLE ARTHROTOMY AND DEBRIDEMENT RESECTION OF TIBIA AND TALUS;  Surgeon: Donnamarie Gables, MD;  Location: [facility] OR;  Service: Orthopedics;  Laterality: Left;  TOTAL SURGERY REQUEST TIME: 3 HOURS   severe fall  02/2002   TBI, COMA   STERIOD INJECTION Right 01/10/2020   Procedure: Steroid Injection;  Surgeon: Donnamarie Gables, MD;  Location: [facility] OR;  Service: Orthopedics;  Laterality: Right;   TENOLYSIS Left 01/10/2020   Procedure: TENDON SHEATH RELEASE/TENOLYSIS;  Surgeon: Donnamarie Gables, MD;  Location: [facility] OR;  Service: Orthopedics;  Laterality: Left;    FAMILY HISTORY: No family history on file.  SOCIAL HISTORY: Social History   Socioeconomic History   Marital status: Married    Spouse name: Steve Gutierrez   Number of children: 0   Years of education: Bachelor's   Highest education level: Not on file  Occupational History   Not on file  Tobacco Use   Smoking status: Never   Smokeless tobacco: Never  Substance and Sexual Activity   Alcohol use: No   Drug use: No   Sexual activity: Not on file  Other Topics Concern   Not on file  Social History Narrative   Patient is married Steve Gutierrez) and lives with his wife and children.   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    Patient is also a Naval architect.   Patient is right-handed.   Patient does not drink any caffeine.   Patient has a Bachelor's degree.         Social Drivers of Corporate investment banker Strain: Low Risk  (02/17/2023)   Received from [facility], [facility]   Overall Financial Resource Strain (CARDIA)    Difficulty of Paying Living Expenses: Not hard at all  Food Insecurity: No Food Insecurity (02/17/2023)   Received from [facility], [facility]   Hunger Vital Sign    Worried About Running Out of Food in the Last Year: Never true    Ran Out of Food in the Last Year: Never true  Transportation Needs: No Transportation Needs (02/17/2023)   Received [facility], [facility]    PRAPARE - Transportation    Lack of Transportation (Medical): No    Lack of Transportation (Non-Medical): No  Physical Activity: Sufficiently Active (02/17/2023)   Received from [facility], [facility]   Exercise Vital Sign    Days of Exercise per Week: 7 days    Minutes of Exercise per Session: 150+ min  Stress: Stress Concern Present (02/17/2023)   Received from [facility], [facility] - Occupational Stress Questionnaire    Feeling of Stress : Rather much  Social Connections:  Moderately Integrated (02/17/2023)   Received from [facility], [facility]   Social Network    How would you rate your social network (family, work, friends)?: Adequate participation with social networks  Intimate Partner Violence: Not At Risk (02/17/2023)   Received from [facility], [facility]   HITS    Over the last 12 months how often did your partner physically hurt you?: Never    Over the last 12 months how often did your partner insult you or talk down to you?: Rarely    Over the last 12 months how often did your partner threaten you with physical harm?: Never    Over the last 12 months how often did your partner scream or curse at you?: Rarely     PHYSICAL EXAM  There were no vitals filed for this visit.   There is no height or weight on file to calculate BMI.  Generalized: Well developed, in no acute distress  Cardiology: normal rate and rhythm, no murmur noted Respiratory: clear to auscultation bilaterally  Neurological examination  Mentation: Alert oriented to time, place, history taking. Follows all commands speech and language fluent Cranial nerve II-XII: Pupils were equal round reactive to light. Extraocular movements were full, visual field were full  Motor: The motor testing reveals 5 over 5 strength of all 4 extremities. Left hand contracture.  Gait and station: Gait is wide but stable.    DIAGNOSTIC DATA (LABS, IMAGING,  TESTING) - I reviewed patient records, labs, notes, testing and imaging myself where available.      No data to display           Lab Results  Component Value Date   WBC 8.4 04/16/2023   HGB 15.4 04/16/2023   HCT 44.7 04/16/2023   MCV 89.2 04/16/2023   PLT 287.0 04/16/2023      Component Value Date/Time   NA 138 04/16/2023 1604   K 4.0 04/16/2023 1604   CL 102 04/16/2023 1604   CO2 27 04/16/2023 1604   GLUCOSE 83 04/16/2023 1604   BUN 19 04/16/2023 1604   CREATININE 0.75 04/16/2023 1604   CALCIUM 9.1 04/16/2023 1604   CALCIUM 9.0 04/16/2023 1604   PROT 7.0 04/16/2023 1604   ALBUMIN 4.2 04/16/2023 1604   AST 19 04/16/2023 1604   ALT 22 04/16/2023 1604   ALKPHOS 51 04/16/2023 1604   BILITOT 0.3 04/16/2023 1604   GFRNONAA >60 01/06/2020 0831   GFRAA >60 01/06/2020 0831   No results found for: "CHOL", "HDL", "LDLCALC", "LDLDIRECT", "TRIG", "CHOLHDL" No results found for: "HGBA1C" Lab Results  Component Value Date   VITAMINB12 414 04/16/2023   Lab Results  Component Value Date   TSH 1.03 04/16/2023     ASSESSMENT AND PLAN 43 y.o. year old male  has a past medical history of Arthritis, Brain injury (HCC) (2003), Cerebral palsy (HCC), Obesity, OSA (obstructive sleep apnea) (2011), and OSA on CPAP (03/22/2014). here with   No diagnosis found.    Dravon Younkin Scrivens is doing well on CPAP therapy. Compliance report reveals excellent compliance. He was encouraged to continue using CPAP nightly and for greater than 4 hours each night. Consider melatonin XR or valerian root for sleep maintenance. We will update supply orders as indicated. Risks of untreated sleep apnea review and education materials provided. Healthy lifestyle habits encouraged. He will follow up in 1 year, sooner if needed. He verbalizes understanding and agreement with this plan.    No orders of the defined types were  placed in this encounter.    No orders of the defined types were placed in this  encounter.     Terrilyn Fick, FNP-C 04/25/2024, 4:28 PM [facility] [address] [place], [place] [address] [phone]

## 2024-04-26 NOTE — Progress Notes (Unsigned)
 Steve Gutierrez

## 2024-04-27 ENCOUNTER — Encounter: Payer: Self-pay | Admitting: Family Medicine

## 2024-04-27 ENCOUNTER — Ambulatory Visit: Payer: BC Managed Care – PPO | Admitting: Family Medicine

## 2024-04-27 VITALS — BP 122/74 | HR 80 | Ht 70.0 in | Wt 268.0 lb

## 2024-04-27 DIAGNOSIS — G4733 Obstructive sleep apnea (adult) (pediatric): Secondary | ICD-10-CM

## 2025-05-01 ENCOUNTER — Ambulatory Visit: Admitting: Family Medicine
